# Patient Record
Sex: Male | Born: 1975
Health system: Southern US, Community
[De-identification: ages and names within clinical notes are randomized; demographics above are authoritative.]

## PROBLEM LIST (undated history)

## (undated) DIAGNOSIS — J302 Other seasonal allergic rhinitis: Secondary | ICD-10-CM

## (undated) DIAGNOSIS — Z85820 Personal history of malignant melanoma of skin: Secondary | ICD-10-CM

## (undated) DIAGNOSIS — J45909 Unspecified asthma, uncomplicated: Secondary | ICD-10-CM

## (undated) DIAGNOSIS — Z9889 Other specified postprocedural states: Secondary | ICD-10-CM

## (undated) DIAGNOSIS — K200 Eosinophilic esophagitis: Secondary | ICD-10-CM

## (undated) HISTORY — DX: Eosinophilic esophagitis: K20.0

## (undated) HISTORY — PX: WISDOM TOOTH EXTRACTION: SHX21

---

## 2009-10-11 ENCOUNTER — Ambulatory Visit: Payer: Self-pay | Admitting: Emergency Medicine

## 2009-10-11 DIAGNOSIS — M79609 Pain in unspecified limb: Secondary | ICD-10-CM | POA: Insufficient documentation

## 2009-10-11 DIAGNOSIS — S9030XA Contusion of unspecified foot, initial encounter: Secondary | ICD-10-CM | POA: Insufficient documentation

## 2009-12-06 ENCOUNTER — Ambulatory Visit: Payer: Self-pay | Admitting: Emergency Medicine

## 2009-12-06 DIAGNOSIS — M25579 Pain in unspecified ankle and joints of unspecified foot: Secondary | ICD-10-CM | POA: Insufficient documentation

## 2009-12-11 ENCOUNTER — Telehealth (INDEPENDENT_AMBULATORY_CARE_PROVIDER_SITE_OTHER): Payer: Self-pay

## 2010-02-27 NOTE — Progress Notes (Signed)
Summary: Courtesy call  Phone Note Outgoing Call   Call placed by: Areta Haber CMA,  December 11, 2009 9:34 AM Call placed to: Patient Summary of Call: Courtesy call to pt - Pt states  swelling is going down, he plans to wait until Wednesday, 12/13/09, to see how his ankle feels. If no better he will contact the office for PT or ortho referral. Pt states the only time he has problems is after icing the ankle, trying to stand on his ankle, but after walking on it for awhile, its gets better.  Pt states he's not in pain though. Initial call taken by: Areta Haber CMA,  December 11, 2009 9:41 AM

## 2010-02-27 NOTE — Assessment & Plan Note (Signed)
Summary: LEFT FOOT PAIN/NH   Vital Signs:  Patient Profile:   35 Years Old Male CC:      Left foot pain from fall yesterday Height:     71 inches Weight:      174 pounds O2 Sat:      99 % O2 treatment:    Room Air Temp:     97.5 degrees F oral Pulse rate:   70 / minute Pulse rhythm:   regular Resp:     12 per minute BP sitting:   114 / 69  (left arm) Cuff size:   regular  Vitals Entered By: Emilio Math (October 11, 2009 8:47 AM)                History of Present Illness History from: patient Chief Complaint: Left foot pain from fall yesterday History of Present Illness: 35yo WM fell off 5' tall scaffolding yesterday.  He works in [employer].  He got tangled up in the crumbling scaffolding and doesn't really know how he fell or what happened.  Now with L foot bruising around the big toe.  Mild pain.  Is able to walk.  Hasn't done anything or taken any medicines.  He just wants to make sure that it isn't broken.  REVIEW OF SYSTEMS Constitutional Symptoms      Denies fever, chills, night sweats, weight loss, weight gain, and fatigue.  Eyes       Denies change in vision, eye pain, eye discharge, glasses, contact lenses, and eye surgery. Ear/Nose/Throat/Mouth       Denies hearing loss/aids, change in hearing, ear pain, ear discharge, dizziness, frequent runny nose, frequent nose bleeds, sinus problems, sore throat, hoarseness, and tooth pain or bleeding.  Respiratory       Denies dry cough, productive cough, wheezing, shortness of breath, asthma, bronchitis, and emphysema/COPD.  Cardiovascular       Denies murmurs, chest pain, and tires easily with exhertion.    Gastrointestinal       Denies stomach pain, nausea/vomiting, diarrhea, constipation, blood in bowel movements, and indigestion. Genitourniary       Denies painful urination, kidney stones, and loss of urinary control. Neurological       Denies paralysis, seizures, and fainting/blackouts. Musculoskeletal  Complains of muscle pain, redness, and swelling.      Denies joint pain, joint stiffness, decreased range of motion, muscle weakness, and gout.  Skin       Complains of bruising.      Denies unusual mles/lumps or sores and hair/skin or nail changes.  Psych       Denies mood changes, temper/anger issues, anxiety/stress, speech problems, depression, and sleep problems.  Past History:  Past Medical History: Unremarkable  Past Surgical History: Removal of melanoma on back  Family History: Mother,Healthy father, Healthy  Social History: Non smoker ETOH-yes  No drugs [employer] Physical Exam General appearance: well developed, well nourished, no acute distress Extremities: L foot: ecchymosis over Left 1st-3rd MT.  Mild TTP over same area.  Axial loading not painful.  No TTP med/lat mall, nav, base of 5th, calc, prox fib.  FROM great toe, normal cap refill Skin: see above MSE: oriented to time, place, and person Assessment New Problems: CONTUSION OF FOOT (ICD-924.20) FOOT PAIN, LEFT (ICD-729.5)  Xray read as normal   Patient Education: Patient and/or caregiver instructed in the following: rest, Tylenol prn, Ibuprofen prn. 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  If painful and limping, should not work on scaffolding nor on a roof or dangerous locations.    The patient and/or caregiver has been counseled thoroughly with regard to medications prescribed including dosage, schedule, interactions, rationale for use, and possible side effects and they verbalize understanding.  Diagnoses and expected course of recovery discussed and will return if not improved as expected or if the condition worsens. Patient and/or caregiver verbalized understanding.   Orders Added: 1)  New Patient Level III [99203] 2)  T-DG Foot  Complete*L* [04540]

## 2010-02-27 NOTE — Assessment & Plan Note (Signed)
Summary: L ANKLE PAIN FROM FALL/WB   Vital Signs:  Patient Profile:   35 Years Old Male CC:      Left ankle injury playing softball last night Height:     71 inches Weight:      167 pounds O2 Sat:      100 % O2 treatment:    Room Air Temp:     98.3 degrees F oral Pulse rate:   70 / minute Pulse rhythm:   regular Resp:     14 per minute BP sitting:   130 / 82  (left arm) Cuff size:   regular  Pt. in pain?   yes    Location:   ankle    Intensity:   7    Type:       sharp  Vitals Entered By: Emilio Math (December 06, 2009 9:36 AM)                   Current Allergies: No known allergies History of Present Illness Chief Complaint: Left ankle injury playing softball last night History of Present Illness: Playing softball last night, slipped, fell, and twisted L ankle.  Layed on the ground for about 10 mins, but then got up and played another 3 games.  Has difficulty running.  He iced it and wrapped it up which helped.  Today is swollen with bruising and dull throbbing constant pain throughtout his ankle.  No radiation of pain.    REVIEW OF SYSTEMS Constitutional Symptoms      Denies fever, chills, night sweats, weight loss, weight gain, and fatigue.  Eyes       Denies change in vision, eye pain, eye discharge, glasses, contact lenses, and eye surgery. Ear/Nose/Throat/Mouth       Denies hearing loss/aids, change in hearing, ear pain, ear discharge, dizziness, frequent runny nose, frequent nose bleeds, sinus problems, sore throat, hoarseness, and tooth pain or bleeding.  Respiratory       Denies dry cough, productive cough, wheezing, shortness of breath, asthma, bronchitis, and emphysema/COPD.  Cardiovascular       Denies murmurs, chest pain, and tires easily with exhertion.    Gastrointestinal       Denies stomach pain, nausea/vomiting, diarrhea, constipation, blood in bowel movements, and indigestion. Genitourniary       Denies painful urination, kidney stones, and loss  of urinary control. Neurological       Denies paralysis, seizures, and fainting/blackouts. Musculoskeletal       Complains of joint pain, joint stiffness, decreased range of motion, redness, and swelling.      Denies muscle pain, muscle weakness, and gout.  Skin       Complains of bruising.      Denies unusual mles/lumps or sores and hair/skin or nail changes.  Psych       Denies mood changes, temper/anger issues, anxiety/stress, speech problems, depression, and sleep problems.  Past History:  Past Medical History: Reviewed history from 10/11/2009 and no changes required. Unremarkable  Past Surgical History: Reviewed history from 10/11/2009 and no changes required. Removal of melanoma on back  Family History: Reviewed history from 10/11/2009 and no changes required. Mother,Healthy father, Healthy  Social History: Reviewed history from 10/11/2009 and no changes required. Non smoker ETOH-yes  No drugs Construction Physical Exam General appearance: well developed, well nourished, no acute distress Heart: regular rate and  rhythm, no murmur Extremities: see below Neurological: distal NV status intact Skin: see below MSE: oriented to time, place,  and person L ankle: FROM, full strength, resisted motions are painful.  TTP medial/lateral malleolus, anterior, ATFL, and Achilles.  No TTP navicular, base of 5th, calcaneus, or proximal fibula.  + swelling and ecchymoses.  He is walking with a limp.   **physical exam was very brief prior to Xray.  Patient had to leave prior to me coming back to fully examine.  Assessment New Problems: ANKLE PAIN, LEFT (ICD-719.47)  Xray is normal Left sprained ankle  Patient Education: Patient and/or caregiver instructed in the following: rest, fluids, Ibuprofen prn.  Plan New Orders: Est. Patient Level IV [78588] T-DG Ankle Complete*L* [id number] Planning Comments:   Rest, ice, elevation, compression, ankle brace as needed *The patient had  to leave prior to me coming back into the room.  The nurse informed him that it wasn't broken according to the Xray report and he left. Follow up with sports med and/or PT if not getting better in 7-10 days   The patient and/or caregiver has been counseled thoroughly with regard to medications prescribed including dosage, schedule, interactions, rationale for use, and possible side effects and they verbalize understanding.  Diagnoses and expected course of recovery discussed and will return if not improved as expected or if the condition worsens. Patient and/or caregiver verbalized understanding.   Orders Added: 1)  Est. Patient Level IV [50277] 2)  T-DG Ankle Complete*L* [73610]  Appended Document: L ANKLE PAIN FROM FALL/WB Patient left before Dr Orson Aloe could go back in and speak to him about his ankle he advised that he had to take a child to the dentist.  I pulled up the x-ray and saw that it was not broken and let the patient know this and advised him to elevate, ice and take ibuprofen for pain as needed.

## 2011-04-04 ENCOUNTER — Emergency Department
Admit: 2011-04-04 | Discharge: 2011-04-04 | Disposition: A | Discharge status: Home / Self Care | Payer: BC Managed Care – PPO

## 2011-04-04 ENCOUNTER — Emergency Department
Admission: EM | Admit: 2011-04-04 | Discharge: 2011-04-04 | Disposition: A | Discharge status: Home Health | Payer: BC Managed Care – PPO | Source: Home / Self Care | Attending: Family Medicine | Admitting: Family Medicine

## 2011-04-04 ENCOUNTER — Encounter: Payer: Self-pay | Admitting: *Deleted

## 2011-04-04 DIAGNOSIS — R0789 Other chest pain: Secondary | ICD-10-CM

## 2011-04-04 HISTORY — DX: Other specified postprocedural states: Z85.820

## 2011-04-04 HISTORY — DX: Other specified postprocedural states: Z98.890

## 2011-04-04 HISTORY — DX: Other seasonal allergic rhinitis: J30.2

## 2011-04-04 NOTE — Discharge Instructions (Signed)
Apply ice pack once or twice daily.  Take Ibuprofen 200mg , 4 tabs every 8 hours with food.   Chest Pain (Nonspecific) Chest pain has many causes. Your pain could be caused by something serious, such as a heart attack or a blood clot in the lungs. It could also be caused by something less serious, such as a chest bruise or a virus. Follow up with your doctor. More lab tests or other studies may be needed to find the cause of your pain. Most of the time, nonspecific chest pain will improve within 2 to 3 days of rest and mild pain medicine. HOME CARE  For chest bruises, you may put ice on the sore area for 15 to 20 minutes, 3 to 4 times a day. Do this only if it makes you or your child feel better.   Put ice in a plastic bag.   Place a towel between the skin and the bag.   Rest for the next 2 to 3 days.   Go back to work if the pain improves.   See your doctor if the pain lasts longer than 1 to 2 weeks.   Only take medicine as told by your doctor.   Quit smoking if you smoke.  GET HELP RIGHT AWAY IF:   There is more pain or pain that spreads to the arm, neck, jaw, back, or belly (abdomen).   You or your child has shortness of breath.   You or your child coughs more than usual or coughs up blood.   You or your child has very bad back or belly pain, feels sick to his or her stomach (nauseous), or throws up (vomits).   You or your child has very bad weakness.   You or your child passes out (faints).   You or your child has a temperature by mouth above 102 F (38.9 C), not controlled by medicine.  Any of these problems may be serious and may be an emergency. Do not wait to see if the problems will go away. Get medical help right away. Call your local emergency services 911 in [place] Do not drive yourself to the hospital. MAKE SURE YOU:   Understand these instructions.   Will watch this condition.   Will get help right away if you or your child is not doing well or gets worse.    Document Released: 07/03/2007 Document Revised: 01/03/2011 Document Reviewed: 07/03/2007 [facility] Patient Information 2012 [place], [place].

## 2011-04-04 NOTE — ED Provider Notes (Signed)
History     CSN: [id number]  Arrival date & time 04/04/11  4098   First MD Initiated Contact with Patient 04/04/11 1940      Chief Complaint  Patient presents with  . Chest Pain      HPI Comments: Patient complains of one week history of intermittent non-radiating mild sharp pain in his left upper chest that is generally precipitated by deep inspiration and certain chest movements.  At time he feels as if his anterior chest is restricted during inspiration.  No shortness of breath or wheezing.  No cough.  No fevers, chills, and sweats.  No palpitations.  No recent respiratory illness.  Symptoms improved by ibuprofen.  No GI symptoms   Patient is a 36 y.o. male presenting with chest pain. The history is provided by the patient.  Chest Pain Episode onset: 1 week ago. Chest pain occurs frequently. The chest pain is unchanged. The pain is associated with breathing and lifting. The pain is currently at 0/10. The severity of the pain is mild. The quality of the pain is described as sharp. The pain does not radiate. Chest pain is worsened by deep breathing and certain positions. Pertinent negatives for primary symptoms include no fever, no fatigue, no syncope, no shortness of breath, no cough, no wheezing, no palpitations, no abdominal pain, no nausea, no vomiting and no dizziness.  Pertinent negatives for associated symptoms include no lower extremity edema. He tried NSAIDs for the symptoms. Risk factors include male gender. Past medical history comments: Unremarkable  His family medical history is significant for hypertension in family.     No pertinent past medical history  Past surgical history:  Resection "wisdom teeth,"  Resection melanoma back  Family history:  Father with hypertension   History  Substance Use Topics  . Smoking status: Non smoker  . Smokeless tobacco: Not on file  . Alcohol Use: Yes      Review of Systems  Constitutional: Negative for fever and fatigue.    Respiratory: Negative for cough, shortness of breath and wheezing.   Cardiovascular: Positive for chest pain. Negative for palpitations and syncope.  Gastrointestinal: Negative for nausea, vomiting and abdominal pain.  Neurological: Negative for dizziness.   No sore throat No cough No pleuritic pain, but has mild left chest discomfort during deep inspiration No wheezing No nasal congestion No post-nasal drainage No sinus pain/pressure No itchy/red eyes No earache No hemoptysis No SOB No fever/chills No nausea No vomiting No abdominal pain No diarrhea No urinary symptoms No skin rashes No fatigue No myalgias No headache Used OTC meds without relief (ibuprofen)  Allergies  Review of patient's allergies indicates no known medication allergies.  Home Medications   Current Outpatient Rx  Name Route Sig Dispense Refill  . FEXOFENADINE HCL 180 MG PO TABS Oral Take 180 mg by mouth daily.      BP 134/87  Pulse 79  Temp(Src) 98.5 F (36.9 C) (Oral)  Resp 16  Ht 5\' 11"  (1.803 m)  Wt 174 lb (78.926 kg)  BMI 24.27 kg/m2  SpO2 100%  Physical Exam Nursing notes and Vital Signs reviewed. Appearance:  Patient appears healthy, stated age, and in no acute distress Eyes:  Pupils are equal, round, and reactive to light and accomodation.  Extraocular movement is intact.  Conjunctivae are not inflamed  Pharynx:  Normal Neck:  Supple.   No adenopathy.  Carotids have normal upstrokes Lungs:  Clear to auscultation.  Breath sounds are equal.  Chest:  No  tenderness over sternum.  Over the left lower edge of pectoralis muscle, there is localized mild tenderness over an intercostal muscle.  Pain is elicited while palpating the intercostal muscle during resisted contraction of the left pectoralis Heart:  Regular rate and rhythm without murmurs, rubs, or gallops.  Abdomen:  Nontender without masses or hepatosplenomegaly.  Bowel sounds are present.  No CVA or flank tenderness.   Extremities:  No edema.  No calf tenderness Skin:  No rash present.   ED Course  Procedures  none   Dg Chest 2 View  04/04/2011  *RADIOLOGY REPORT*  Clinical Data: Left anterior chest pain with deep inspiration.  CHEST - 2 VIEW  Comparison: None.  Findings: Heart size and vascularity are normal and the lungs are clear.  No osseous abnormality.  IMPRESSION: Normal chest.  Original Report Authenticated By: Gwynn Burly, M.D.    EKG:  No acute changes  1. Chest pain, musculoskeletal       MDM  Reassurance Apply ice pack once or twice daily.  Take Ibuprofen 200mg , 4 tabs every 8 hours with food. Be careful when lifting. Followup with Family Doctor if not improved in one week or if symptoms worsen        Donna Christen, MD 04/04/11 2017

## 2011-04-04 NOTE — ED Notes (Signed)
Pt c/o LT sided chest pain x 1 wk with inhalation. Denies injury.

## 2012-01-29 HISTORY — PX: ESOPHAGOGASTRODUODENOSCOPY: SHX1529

## 2014-05-01 ENCOUNTER — Encounter: Payer: Self-pay | Admitting: Emergency Medicine

## 2014-05-01 ENCOUNTER — Emergency Department (INDEPENDENT_AMBULATORY_CARE_PROVIDER_SITE_OTHER)
Admission: EM | Admit: 2014-05-01 | Discharge: 2014-05-01 | Disposition: A | Discharge status: Home / Self Care | Payer: BLUE CROSS/BLUE SHIELD | Source: Home / Self Care | Attending: Emergency Medicine | Admitting: Emergency Medicine

## 2014-05-01 DIAGNOSIS — J322 Chronic ethmoidal sinusitis: Secondary | ICD-10-CM

## 2014-05-01 MED ORDER — CETIRIZINE-PSEUDOEPHEDRINE ER 5-120 MG PO TB12: 1.0000 | ORAL_TABLET | Freq: Every day | ORAL | Status: DC

## 2014-05-01 MED ORDER — AMOXICILLIN-POT CLAVULANATE 875-125 MG PO TABS: 1.0000 | ORAL_TABLET | Freq: Two times a day (BID) | ORAL | Status: DC

## 2014-05-01 NOTE — ED Provider Notes (Signed)
CSN: [id number]     Arrival date & time 05/01/14  1317 History   First MD Initiated Contact with Patient 05/01/14 1349     Chief Complaint  Patient presents with  . Sinusitis   (Consider location/radiation/quality/duration/timing/severity/associated sxs/prior Treatment) Patient is a 39 y.o. male presenting with sinusitis. The history is provided by the patient. No language interpreter was used.  Sinusitis Pain details:    Location:  Frontal and maxillary   Quality:  Aching   Severity:  Moderate   Duration:  2 weeks   Timing:  Constant Duration:  2 weeks Progression:  Worsening Chronicity:  New Relieved by:  Nothing Worsened by:  Nothing tried Ineffective treatments:  None tried Associated symptoms: rhinorrhea and sore throat   Associated symptoms: no headaches   Risk factors: no allergic reaction and no asthma     Past Medical History  Diagnosis Date  . Seasonal allergies   . History of melanoma excision    Past Surgical History  Procedure Laterality Date  . Wisdom tooth extraction     Family History  Problem Relation Age of Onset  . Hypertension Father    History  Substance Use Topics  . Smoking status: Never Smoker   . Smokeless tobacco: Current User  . Alcohol Use: 1.2 oz/week    2 Cans of beer per week     Comment: occassionally    Review of Systems  HENT: Positive for rhinorrhea, sinus pressure and sore throat.   Neurological: Negative for headaches.  All other systems reviewed and are negative.   Allergies  Review of patient's allergies indicates no known allergies.  Home Medications   Prior to Admission medications   Medication Sig Start Date End Date Taking? Authorizing Provider  amoxicillin-clavulanate (AUGMENTIN) 875-125 MG per tablet Take 1 tablet by mouth every 12 (twelve) hours. 05/01/14   Fransico Meadow, PA-C  cetirizine-pseudoephedrine (ZYRTEC-D) 5-120 MG per tablet Take 1 tablet by mouth daily. 05/01/14   Fransico Meadow, PA-C  fexofenadine  (ALLEGRA) 180 MG tablet Take 180 mg by mouth daily.    Historical Provider, MD   BP 118/83 mmHg  Pulse 78  Temp(Src) 97.4 F (36.3 C) (Oral)  Wt 178 lb (80.74 kg)  SpO2 98% Physical Exam  Constitutional: He is oriented to person, place, and time. He appears well-developed and well-nourished.  HENT:  Head: Normocephalic and atraumatic.  Eyes: Conjunctivae and EOM are normal. Pupils are equal, round, and reactive to light.  Neck: Normal range of motion.  Cardiovascular: Normal rate and normal heart sounds.   Pulmonary/Chest: Effort normal.  Abdominal: Soft. He exhibits no distension.  Musculoskeletal: Normal range of motion.  Neurological: He is alert and oriented to person, place, and time.  Skin: Skin is warm.  Psychiatric: He has a normal mood and affect.  Nursing note and vitals reviewed.   ED Course  Procedures (including critical care time) Labs Review Labs Reviewed - No data to display  Imaging Review No results found.   MDM   1. Ethmoid sinusitis, unspecified chronicity    augmentin Zyrtec Return if any problems. AVS    Fransico Meadow, PA-C 05/01/14 1622

## 2014-05-01 NOTE — ED Notes (Signed)
Pt c/o runny nose, HA, post nasal drip and facial pain/pressure x2 weeks. Denies fever...states some cough and sore throat that he believes is allergy related.

## 2014-05-01 NOTE — Discharge Instructions (Signed)

## 2014-06-01 ENCOUNTER — Emergency Department (INDEPENDENT_AMBULATORY_CARE_PROVIDER_SITE_OTHER): Payer: BLUE CROSS/BLUE SHIELD

## 2014-06-01 ENCOUNTER — Encounter: Payer: Self-pay | Admitting: *Deleted

## 2014-06-01 ENCOUNTER — Emergency Department (INDEPENDENT_AMBULATORY_CARE_PROVIDER_SITE_OTHER)
Admission: EM | Admit: 2014-06-01 | Discharge: 2014-06-01 | Disposition: A | Discharge status: Home / Self Care | Payer: BLUE CROSS/BLUE SHIELD | Source: Home / Self Care | Attending: Family Medicine | Admitting: Family Medicine

## 2014-06-01 DIAGNOSIS — R0981 Nasal congestion: Secondary | ICD-10-CM | POA: Diagnosis not present

## 2014-06-01 DIAGNOSIS — J302 Other seasonal allergic rhinitis: Secondary | ICD-10-CM | POA: Diagnosis not present

## 2014-06-01 MED ORDER — MOMETASONE FUROATE 50 MCG/ACT NA SUSP: NASAL | Status: DC

## 2014-06-01 MED ORDER — MONTELUKAST SODIUM 10 MG PO TABS: 10.0000 mg | ORAL_TABLET | Freq: Every day | ORAL | Status: DC

## 2014-06-01 MED ORDER — TRIAMCINOLONE ACETONIDE 40 MG/ML IJ SUSP
40.0000 mg | Freq: Once | INTRAMUSCULAR | Status: AC
  Administered 2014-06-01: 40 mg via INTRAMUSCULAR

## 2014-06-01 NOTE — ED Notes (Signed)
Pt c/o nasal congestion x 3-4 mths. He has taken Decongestants, Amoxicillin, and Afrin with no relief.

## 2014-06-01 NOTE — Discharge Instructions (Signed)
Continue antihistamine daily, and alternate to a different antihistamine each month.  Take pseudoephedrine for congestion.  Stop Afrin in one side of nose and continue other side, followed by Nasonex spray.  After about 5 to 7 days resume Afrin in the other nostril, following with Nasonex spray.  Stop Afrin in about 5 days.  Consider using saline irrigation after nasal congestion decreases.    Allergic Rhinitis Allergic rhinitis is when the mucous membranes in the nose respond to allergens. Allergens are particles in the air that cause your body to have an allergic reaction. This causes you to release allergic antibodies. Through a chain of events, these eventually cause you to release histamine into the blood stream. Although meant to protect the body, it is this release of histamine that causes your discomfort, such as frequent sneezing, congestion, and an itchy, runny nose.  CAUSES  Seasonal allergic rhinitis (hay fever) is caused by pollen allergens that may come from grasses, trees, and weeds. Year-round allergic rhinitis (perennial allergic rhinitis) is caused by allergens such as house dust mites, pet dander, and mold spores.  SYMPTOMS   Nasal stuffiness (congestion).  Itchy, runny nose with sneezing and tearing of the eyes. DIAGNOSIS  Your health care provider can help you determine the allergen or allergens that trigger your symptoms. If you and your health care provider are unable to determine the allergen, skin or blood testing may be used. TREATMENT  Allergic rhinitis does not have a cure, but it can be controlled by:  Medicines and allergy shots (immunotherapy).  Avoiding the allergen. Hay fever may often be treated with antihistamines in pill or nasal spray forms. Antihistamines block the effects of histamine. There are over-the-counter medicines that may help with nasal congestion and swelling around the eyes. Check with your health care provider before taking or giving this  medicine.  If avoiding the allergen or the medicine prescribed do not work, there are many new medicines your health care provider can prescribe. Stronger medicine may be used if initial measures are ineffective. Desensitizing injections can be used if medicine and avoidance does not work. Desensitization is when a patient is given ongoing shots until the body becomes less sensitive to the allergen. Make sure you follow up with your health care provider if problems continue. HOME CARE INSTRUCTIONS It is not possible to completely avoid allergens, but you can reduce your symptoms by taking steps to limit your exposure to them. It helps to know exactly what you are allergic to so that you can avoid your specific triggers. SEEK MEDICAL CARE IF:   You have a fever.  You develop a cough that does not stop easily (persistent).  You have shortness of breath.  You start wheezing.  Symptoms interfere with normal daily activities. Document Released: 10/09/2000 Document Revised: 01/19/2013 Document Reviewed: 09/21/2012 [facility] Patient Information 2015 [place], [place]. This information is not intended to replace advice given to you by your health care provider. Make sure you discuss any questions you have with your health care provider.

## 2014-06-01 NOTE — ED Provider Notes (Signed)
CSN: [id number]     Arrival date & time 06/01/14  1753 History   First MD Initiated Contact with Patient 06/01/14 1812     Chief Complaint  Patient presents with  . Nasal Congestion      HPI Comments: Patient has a history of seasonal rhinitis that has become increasingly severe.  In the past he could control his symptoms by alternating different antihistamines such as Allegra, Claritin, and Zyrtec. He has tried Flonase in the past without much success.  One month ago he was treated with Augmentin for sinusitis, but did not have much improvement.  He has now been using Afrin spray in order to breath through his nares at night.  He has had poor sleep as a result of mouth breathing.  The history is provided by the patient.    Past Medical History  Diagnosis Date  . Seasonal allergies   . History of melanoma excision    Past Surgical History  Procedure Laterality Date  . Wisdom tooth extraction     Family History  Problem Relation Age of Onset  . Hypertension Father    History  Substance Use Topics  . Smoking status: Never Smoker   . Smokeless tobacco: Current User  . Alcohol Use: 1.2 oz/week    2 Cans of beer per week     Comment: occassionally    Review of Systems + sore throat + cough No pleuritic pain No wheezing + nasal congestion + post-nasal drainage + sinus pain/pressure + eye drainage No earache No hemoptysis No SOB No fever/chills No nausea No vomiting No abdominal pain No diarrhea No urinary symptoms No skin rash + fatigue No myalgias + headache Used OTC meds without relief  Allergies  Review of patient's allergies indicates no known allergies.  Home Medications   Prior to Admission medications   Medication Sig Start Date End Date Taking? Authorizing Provider  cetirizine-pseudoephedrine (ZYRTEC-D) 5-120 MG per tablet Take 1 tablet by mouth daily. 05/01/14   Fransico Meadow, PA-C  fexofenadine (ALLEGRA) 180 MG tablet Take 180 mg by mouth daily.     Historical Provider, MD  mometasone (NASONEX) 50 MCG/ACT nasal spray Place 2 sprays in each side of nose once daily 06/01/14   Kandra Nicolas, MD  montelukast (SINGULAIR) 10 MG tablet Take 1 tablet (10 mg total) by mouth at bedtime. 06/01/14   Kandra Nicolas, MD   BP 123/83 mmHg  Pulse 80  Temp(Src) 97.8 F (36.6 C) (Oral)  Resp 16  Ht 5\' 11"  (1.803 m)  Wt 180 lb (81.647 kg)  BMI 25.12 kg/m2  SpO2 98% Physical Exam Nursing notes and Vital Signs reviewed. Appearance:  Patient appears stated age, and in no acute distress Eyes:  Pupils are equal, round, and reactive to light and accomodation.  Extraocular movement is intact.  Conjunctivae are not inflamed  Ears:  Canals normal.  Tympanic membranes normal.  Nose:   Congested turbinates with clear discharge.  No sinus tenderness.    Pharynx:  Normal Neck:  Supple.  No adenopathy Lungs:  Clear to auscultation.  Breath sounds are equal.  Heart:  Regular rate and rhythm without murmurs, rubs, or gallops.    Skin:  No rash present.   ED Course  Procedures  none  Imaging Review Dg Sinuses Complete  06/01/2014   CLINICAL DATA:  Sinus congestion for 1 month  EXAM: PARANASAL SINUSES - COMPLETE 3 + VIEW  COMPARISON:  None.  FINDINGS: The frontal sinuses are hypoplastic.  The ethmoid, maxillary and sphenoid sinuses are well aerated without focal air-fluid level or mucosal thickening. No acute bony abnormality is noted.  IMPRESSION: Hypoplastic frontal sinuses.  No acute abnormality is noted.   Electronically Signed   By: Inez Catalina M.D.   On: 06/01/2014 18:51     MDM   1. Seasonal allergic rhinitis    Kenalog 40mg  IM Begin trial of Singulair 10mg  HS.  Begin trial of Nasonex Continue antihistamine daily, and alternate to a different antihistamine each month.  Take pseudoephedrine for congestion.  Stop Afrin in one side of nose and continue other side, followed by Nasonex spray.  After about 5 to 7 days resume Afrin in the other nostril,  following with Nasonex spray.  Stop Afrin in about 5 days.  Consider using saline irrigation after nasal congestion decreases. Followup with allergist if not improving about one month.    Kandra Nicolas, MD 06/01/14 [phone]

## 2014-06-04 ENCOUNTER — Telehealth: Payer: Self-pay | Admitting: Emergency Medicine

## 2014-08-18 ENCOUNTER — Emergency Department (INDEPENDENT_AMBULATORY_CARE_PROVIDER_SITE_OTHER)
Admission: EM | Admit: 2014-08-18 | Discharge: 2014-08-18 | Disposition: A | Discharge status: Home / Self Care | Payer: BLUE CROSS/BLUE SHIELD | Source: Home / Self Care | Attending: Emergency Medicine | Admitting: Emergency Medicine

## 2014-08-18 ENCOUNTER — Encounter: Payer: Self-pay | Admitting: Emergency Medicine

## 2014-08-18 DIAGNOSIS — J209 Acute bronchitis, unspecified: Secondary | ICD-10-CM | POA: Diagnosis not present

## 2014-08-18 DIAGNOSIS — J0101 Acute recurrent maxillary sinusitis: Secondary | ICD-10-CM | POA: Diagnosis not present

## 2014-08-18 DIAGNOSIS — J309 Allergic rhinitis, unspecified: Secondary | ICD-10-CM | POA: Diagnosis not present

## 2014-08-18 MED ORDER — PROMETHAZINE-CODEINE 6.25-10 MG/5ML PO SYRP: ORAL_SOLUTION | ORAL | Status: DC

## 2014-08-18 MED ORDER — MONTELUKAST SODIUM 10 MG PO TABS: 10.0000 mg | ORAL_TABLET | Freq: Every day | ORAL | Status: DC

## 2014-08-18 MED ORDER — PREDNISONE 10 MG (21) PO TBPK: ORAL_TABLET | ORAL | Status: DC

## 2014-08-18 MED ORDER — AZITHROMYCIN 250 MG PO TABS: ORAL_TABLET | ORAL | Status: DC

## 2014-08-18 MED ORDER — TRIAMCINOLONE ACETONIDE 40 MG/ML IJ SUSP
40.0000 mg | Freq: Once | INTRAMUSCULAR | Status: AC
  Administered 2014-08-18: 40 mg via INTRAMUSCULAR

## 2014-08-18 MED ORDER — MOMETASONE FUROATE 50 MCG/ACT NA SUSP: 2.0000 | Freq: Every day | NASAL | Status: DC

## 2014-08-18 NOTE — ED Notes (Signed)
Productive cough, congestion, runny nose, headache, can't sleep x 6 days

## 2014-08-18 NOTE — ED Provider Notes (Addendum)
CSN: [id number]     Arrival date & time 08/18/14  5573 History   First MD Initiated Contact with Patient 08/18/14 0902     Chief Complaint  Patient presents with  . Cough    HPI URI HISTORY  Shane Thomas is a 39 y.o. male who complains of onset of cold symptoms for 6 days. This is a seasonal and recurrent problem.  Back in May, was seen here and shot of Kenalog was very helpful.  He has recurrent seasonal allergic rhinitis. He thinks the Singulair prescribed by Dr. Angelica Ran 10 mg at bedtime was helpful, but he ran out of that.  May have a low-grade fever. He is not sure. No chills/sweats Complains of discolored rhinorrhea. Perhaps discolored sputum.  No sinus pain/pressure No sore throat Occasional wheezing with cough Positive chest congestion No hemoptysis No shortness of breath No pleuritic pain  No itchy/red eyes No earache  No nausea No vomiting No abdominal pain No diarrhea  No skin rashes +  Fatigue No myalgias No headache   He does not have a PCP or allergist. He denies chronically using Afrin  Past Medical History  Diagnosis Date  . Seasonal allergies   . History of melanoma excision    Past Surgical History  Procedure Laterality Date  . Wisdom tooth extraction     Family History  Problem Relation Age of Onset  . Hypertension Father    History  Substance Use Topics  . Smoking status: Never Smoker   . Smokeless tobacco: Current User  . Alcohol Use: 1.2 oz/week    2 Cans of beer per week     Comment: occassionally    Review of Systems Remainder of Review of Systems negative for acute change except as noted in the HPI.  Allergies  Review of patient's allergies indicates not on file.  Home Medications   Prior to Admission medications   Medication Sig Start Date End Date Taking? Authorizing Provider  azithromycin (ZITHROMAX Z-PAK) 250 MG tablet Take 2 tablets on day one, then 1 tablet daily on days 2 through 5 08/18/14   Jacqulyn Cane, MD  mometasone  (NASONEX) 50 MCG/ACT nasal spray Place 2 sprays into the nose daily. 1 or 2 sprays in each nostril twice daily 08/18/14   Jacqulyn Cane, MD  montelukast (SINGULAIR) 10 MG tablet Take 1 tablet (10 mg total) by mouth at bedtime. 08/18/14   Jacqulyn Cane, MD  predniSONE (STERAPRED UNI-PAK 21 TAB) 10 MG (21) TBPK tablet Take as directed for 6 days.--Take 6 on day 1, 5 on day 2, 4 on day 3, then 3 tablets on day 4, then 2 tablets on day 5, then 1 on day 6. 08/18/14   Jacqulyn Cane, MD  promethazine-codeine [facility] WITH CODEINE) 6.25-10 MG/5ML syrup Take 1-2 teaspoons every 4-6 hours as needed for cough. May cause drowsiness. 08/18/14   Jacqulyn Cane, MD   BP 122/80 mmHg  Pulse 78  Temp(Src) 97.7 F (36.5 C) (Oral)  Ht 5\' 11"  (1.803 m)  Wt 183 lb (83.008 kg)  BMI 25.53 kg/m2  SpO2 96% Physical Exam  Constitutional: He is oriented to person, place, and time. He appears well-developed and well-nourished. No distress.  HENT:  Head: Normocephalic and atraumatic.  Right Ear: Tympanic membrane, external ear and ear canal normal.  Left Ear: Tympanic membrane, external ear and ear canal normal.  Nose: Mucosal edema and rhinorrhea present. Right sinus exhibits maxillary sinus tenderness. Left sinus exhibits maxillary sinus tenderness.  Mouth/Throat: Oropharynx is clear  and moist. No oral lesions. No oropharyngeal exudate.  Very boggy turbinates seromucoid drainage. Prominent lymphoid hyperplasia in serous postnasal drainage posterior pharynx  Eyes: Right eye exhibits no discharge. Left eye exhibits no discharge. No scleral icterus.  Neck: Neck supple. No JVD present.  Cardiovascular: Normal rate, regular rhythm and normal heart sounds.   Pulmonary/Chest: Effort normal and breath sounds normal. He has no wheezes. He has no rales.  Lungs clear except for minimal expiratory wheeze anteriorly on forced expiration  Abdominal: There is no tenderness.  Musculoskeletal: He exhibits no edema.  Lymphadenopathy:     He has no cervical adenopathy.  Neurological: He is alert and oriented to person, place, and time.  Skin: Skin is warm and dry. No rash noted.  Psychiatric: He has a normal mood and affect.  Nursing note and vitals reviewed.   ED Course  Procedures (including critical care time) Labs Review Labs Reviewed - No data to display  Imaging Review No results found.   MDM   1. Acute recurrent maxillary sinusitis   2. Allergic sinusitis   3. Acute bronchitis with bronchospasm    Treatment options discussed, as well as risks, benefits, alternatives. Patient voiced understanding and agreement with the following plans: Kenalog 40 mg IM stat, as he states that worked great in May and he requests that now. Prednisone 10 mg-six-day dosepak Z-Pak for her bacterial and atypical coverage New Prescriptions   AZITHROMYCIN (ZITHROMAX Z-PAK) 250 MG TABLET    Take 2 tablets on day one, then 1 tablet daily on days 2 through 5   MOMETASONE (NASONEX) 50 MCG/ACT NASAL SPRAY    Place 2 sprays into the nose daily. 1 or 2 sprays in each nostril twice daily   MONTELUKAST (SINGULAIR) 10 MG TABLET    Take 1 tablet (10 mg total) by mouth at bedtime.   PREDNISONE (STERAPRED UNI-PAK 21 TAB) 10 MG (21) TBPK TABLET    Take as directed for 6 days.--Take 6 on day 1, 5 on day 2, 4 on day 3, then 3 tablets on day 4, then 2 tablets on day 5, then 1 on day 6.   PROMETHAZINE-CODEINE (PHENERGAN WITH CODEINE) 6.25-10 MG/5ML SYRUP    Take 1-2 teaspoons every 4-6 hours as needed for cough. May cause drowsiness.   We made him an appointment for establishing with PCP with Dr. Georgina Snell at primary care in [place] and to recheck within 2-3 weeks. Precautions discussed. Other advice given Red flags discussed. Questions invited and answered. Patient voiced understanding and agreement.     Jacqulyn Cane, MD 08/18/14 1694  Jacqulyn Cane, MD 08/18/14 [phone]

## 2014-09-01 ENCOUNTER — Encounter: Payer: Self-pay | Admitting: Family Medicine

## 2014-09-01 ENCOUNTER — Ambulatory Visit (INDEPENDENT_AMBULATORY_CARE_PROVIDER_SITE_OTHER): Payer: BLUE CROSS/BLUE SHIELD | Admitting: Family Medicine

## 2014-09-01 VITALS — BP 129/79 | HR 77 | Ht 71.0 in | Wt 181.0 lb

## 2014-09-01 DIAGNOSIS — Z720 Tobacco use: Secondary | ICD-10-CM

## 2014-09-01 DIAGNOSIS — J3089 Other allergic rhinitis: Secondary | ICD-10-CM | POA: Diagnosis not present

## 2014-09-01 DIAGNOSIS — J309 Allergic rhinitis, unspecified: Secondary | ICD-10-CM | POA: Insufficient documentation

## 2014-09-01 MED ORDER — MOMETASONE FUROATE 50 MCG/ACT NA SUSP: 2.0000 | Freq: Two times a day (BID) | NASAL | Status: DC

## 2014-09-01 MED ORDER — MONTELUKAST SODIUM 10 MG PO TABS: 10.0000 mg | ORAL_TABLET | Freq: Every day | ORAL | Status: DC

## 2014-09-01 NOTE — Progress Notes (Signed)
Shane Thomas is a 39 y.o. male who presents to [facility]  today for establish care and discuss Raynaud's postnasal drip and congestion. Symptoms present ongoing for years typically worse in the spring and summer. He thinks outside allergies are causing his problem is he is always worse after he mows his lawn. He does have an indoor indoor dog that does have access to his bedroom.  He has used Nasonex and Singulair which have helped some. He notes nasal congestion and runny nose and itchy watery eyes post nasal drip and occasional dry nonproductive cough.   Past Medical History  Diagnosis Date  . Seasonal allergies   . History of melanoma excision    Past Surgical History  Procedure Laterality Date  . Wisdom tooth extraction     History  Substance Use Topics  . Smoking status: Never Smoker   . Smokeless tobacco: Current User  . Alcohol Use: 1.2 oz/week    2 Cans of beer per week     Comment: occassionally   ROS as above Medications: Current Outpatient Prescriptions  Medication Sig Dispense Refill  . mometasone (NASONEX) 50 MCG/ACT nasal spray Place 2 sprays into the nose 2 (two) times daily. 1 or 2 sprays in each nostril twice daily 17 g 12  . montelukast (SINGULAIR) 10 MG tablet Take 1 tablet (10 mg total) by mouth at bedtime. 90 tablet 3   No current facility-administered medications for this visit.   Not on File   Exam:  BP 129/79 mmHg  Pulse 77  Ht 5\' 11"  (1.803 m)  Wt 181 lb (82.101 kg)  BMI 25.26 kg/m2 Gen: Well NAD HEENT: EOMI,  MMM inflamed nasal tremors bilaterally with cobblestoning of the posterior pharynx. Normal tympanic membranes bilaterally Lungs: Normal work of breathing. CTABL Heart: RRR no MRG Abd: NABS, Soft. Nondistended, Nontender Exts: Brisk capillary refill, warm and well perfused.   No results found for this or any previous visit (from the past 24 hour(s)). No results found.   Please see individual  assessment and plan sections.

## 2014-09-01 NOTE — Assessment & Plan Note (Signed)
Persistent allergic rhinitis. Trial of Nasonex Singulair Zyrtec and Zaditor. If not better will refer to allergy for allergy testing.

## 2014-09-01 NOTE — Patient Instructions (Signed)
Thank you for coming in today. Take zyrtec of allegra or claratin daily.  Continue the nasal spray twice daily.  Continue Singulair. Keep the dog out of the bedroom. Using a N-95 mask to mow the lawn or get someone else to do it.  Return for fasting check up on your birthday or sooner if needed.  Call or go to the emergency room if you get worse, have trouble breathing, have chest pains, or palpitations.   Use Zaditor eye drops as needed for itchy eyes.   Allergic Rhinitis Allergic rhinitis is when the mucous membranes in the nose respond to allergens. Allergens are particles in the air that cause your body to have an allergic reaction. This causes you to release allergic antibodies. Through a chain of events, these eventually cause you to release histamine into the blood stream. Although meant to protect the body, it is this release of histamine that causes your discomfort, such as frequent sneezing, congestion, and an itchy, runny nose.  CAUSES  Seasonal allergic rhinitis (hay fever) is caused by pollen allergens that may come from grasses, trees, and weeds. Year-round allergic rhinitis (perennial allergic rhinitis) is caused by allergens such as house dust mites, pet dander, and mold spores.  SYMPTOMS   Nasal stuffiness (congestion).  Itchy, runny nose with sneezing and tearing of the eyes. DIAGNOSIS  Your health care provider can help you determine the allergen or allergens that trigger your symptoms. If you and your health care provider are unable to determine the allergen, skin or blood testing may be used. TREATMENT  Allergic rhinitis does not have a cure, but it can be controlled by:  Medicines and allergy shots (immunotherapy).  Avoiding the allergen. Hay fever may often be treated with antihistamines in pill or nasal spray forms. Antihistamines block the effects of histamine. There are over-the-counter medicines that may help with nasal congestion and swelling around the eyes.  Check with your health care provider before taking or giving this medicine.  If avoiding the allergen or the medicine prescribed do not work, there are many new medicines your health care provider can prescribe. Stronger medicine may be used if initial measures are ineffective. Desensitizing injections can be used if medicine and avoidance does not work. Desensitization is when a patient is given ongoing shots until the body becomes less sensitive to the allergen. Make sure you follow up with your health care provider if problems continue. HOME CARE INSTRUCTIONS It is not possible to completely avoid allergens, but you can reduce your symptoms by taking steps to limit your exposure to them. It helps to know exactly what you are allergic to so that you can avoid your specific triggers. SEEK MEDICAL CARE IF:   You have a fever.  You develop a cough that does not stop easily (persistent).  You have shortness of breath.  You start wheezing.  Symptoms interfere with normal daily activities. Document Released: 10/09/2000 Document Revised: 01/19/2013 Document Reviewed: 09/21/2012 [facility] Patient Information 2015 [place], [place]. This information is not intended to replace advice given to you by your health care provider. Make sure you discuss any questions you have with your health care provider.

## 2014-09-20 ENCOUNTER — Encounter: Payer: Self-pay | Admitting: *Deleted

## 2014-09-20 ENCOUNTER — Emergency Department (INDEPENDENT_AMBULATORY_CARE_PROVIDER_SITE_OTHER)
Admission: EM | Admit: 2014-09-20 | Discharge: 2014-09-20 | Disposition: A | Discharge status: Home / Self Care | Payer: BLUE CROSS/BLUE SHIELD | Source: Home / Self Care | Attending: Family Medicine | Admitting: Family Medicine

## 2014-09-20 DIAGNOSIS — L282 Other prurigo: Secondary | ICD-10-CM | POA: Diagnosis not present

## 2014-09-20 MED ORDER — DIPHENHYDRAMINE HCL 25 MG PO TABS: 25.0000 mg | ORAL_TABLET | Freq: Four times a day (QID) | ORAL | Status: DC

## 2014-09-20 MED ORDER — TRIAMCINOLONE ACETONIDE 0.1 % EX CREA: 1.0000 "application " | TOPICAL_CREAM | Freq: Two times a day (BID) | CUTANEOUS | Status: DC

## 2014-09-20 NOTE — ED Notes (Signed)
Pt c/o rash on his LT rib area x 3-4 days. Denies pain.

## 2014-09-20 NOTE — ED Provider Notes (Signed)
CSN: [id number]     Arrival date & time 09/20/14  1715 History   First MD Initiated Contact with Patient 09/20/14 1720     Chief Complaint  Patient presents with  . Rash   (Consider location/radiation/quality/duration/timing/severity/associated sxs/prior Treatment) HPI Pt is a 39yo male presenting to [facility] with c/o 3-4 day hx of gradually worsening erythematous pruritic rash on Left side of chest wall. Pt states the rash looks like mosquito bites and itches like mosquito bites but are of redness if bigger than a normal mosquito bite. Denies pain. Itching is mild to moderate in severity. States he has tried not to scratch the rash. Denies know contact with poison ivy. No known bug bites. Denies fever, chills, n/v/d. Denies new soaps, lotions or medications. Denies sick contacts or recent travel.  Past Medical History  Diagnosis Date  . Seasonal allergies   . History of melanoma excision    Past Surgical History  Procedure Laterality Date  . Wisdom tooth extraction     Family History  Problem Relation Age of Onset  . Hypertension Father    Social History  Substance Use Topics  . Smoking status: Never Smoker   . Smokeless tobacco: Current User  . Alcohol Use: 1.2 oz/week    2 Cans of beer per week     Comment: occassionally    Review of Systems  Constitutional: Negative for fever and chills.  Respiratory: Negative for cough and shortness of breath.   Cardiovascular: Negative for chest pain and palpitations.  Gastrointestinal: Negative for nausea, vomiting, abdominal pain and diarrhea.  Musculoskeletal: Negative for myalgias and arthralgias.  Skin: Positive for rash. Negative for color change and wound.    Allergies  Review of patient's allergies indicates no known allergies.  Home Medications   Prior to Admission medications   Medication Sig Start Date End Date Taking? Authorizing Provider  diphenhydrAMINE (BENADRYL) 25 MG tablet Take 1 tablet (25 mg total) by mouth every  6 (six) hours. 09/20/14   Noland Fordyce, PA-C  mometasone (NASONEX) 50 MCG/ACT nasal spray Place 2 sprays into the nose 2 (two) times daily. 1 or 2 sprays in each nostril twice daily 09/01/14   Gregor Hams, MD  montelukast (SINGULAIR) 10 MG tablet Take 1 tablet (10 mg total) by mouth at bedtime. 09/01/14   Gregor Hams, MD  triamcinolone cream (KENALOG) 0.1 % Apply 1 application topically 2 (two) times daily. 09/20/14   Noland Fordyce, PA-C   BP 109/77 mmHg  Pulse 79  Temp(Src) 98.5 F (36.9 C) (Oral)  Resp 18  Ht 5\' 11"  (1.803 m)  Wt 179 lb (81.194 kg)  BMI 24.98 kg/m2  SpO2 98% Physical Exam  Constitutional: He is oriented to person, place, and time. He appears well-developed and well-nourished.  HENT:  Head: Normocephalic and atraumatic.  Eyes: EOM are normal.  Neck: Normal range of motion.  Cardiovascular: Normal rate.   Pulmonary/Chest: Effort normal and breath sounds normal. No respiratory distress. He exhibits no tenderness.  Musculoskeletal: Normal range of motion.  Neurological: He is alert and oriented to person, place, and time.  Skin: Skin is warm and dry. Rash noted. There is erythema.     3 raised linear lesions of erythema c/w wheal, centralized yellow pustule. No bleeding or discharge. Non-tender.   Psychiatric: He has a normal mood and affect. His behavior is normal.  Nursing note and vitals reviewed.   ED Course  Procedures (including critical care time) Labs Review Labs Reviewed - No  data to display  Imaging Review No results found.   MDM   1. Pruritic rash     Pt presenting to [facility] with rash on left side of chest wall. Rash c/w insect bite vs local allergic reaction. No evidence of underlying infection. Appearance of rash not c/w shingles. Rx: triamcinolone and benadryl 25mg  Q6h as needed. F/u with PCP in 1 week if not improving, sooner if worsening. Patient verbalized understanding and agreement with treatment plan.     Noland Fordyce, PA-C 09/20/14  [phone]

## 2014-12-28 ENCOUNTER — Ambulatory Visit (INDEPENDENT_AMBULATORY_CARE_PROVIDER_SITE_OTHER): Payer: BLUE CROSS/BLUE SHIELD | Admitting: Family Medicine

## 2014-12-28 ENCOUNTER — Encounter: Payer: Self-pay | Admitting: Family Medicine

## 2014-12-28 ENCOUNTER — Ambulatory Visit (INDEPENDENT_AMBULATORY_CARE_PROVIDER_SITE_OTHER): Payer: BLUE CROSS/BLUE SHIELD

## 2014-12-28 VITALS — BP 118/83 | HR 87 | Temp 98.0°F | Wt 180.0 lb

## 2014-12-28 DIAGNOSIS — R05 Cough: Secondary | ICD-10-CM | POA: Diagnosis not present

## 2014-12-28 DIAGNOSIS — J209 Acute bronchitis, unspecified: Secondary | ICD-10-CM | POA: Diagnosis not present

## 2014-12-28 DIAGNOSIS — R0989 Other specified symptoms and signs involving the circulatory and respiratory systems: Secondary | ICD-10-CM

## 2014-12-28 DIAGNOSIS — R059 Cough, unspecified: Secondary | ICD-10-CM | POA: Insufficient documentation

## 2014-12-28 MED ORDER — PREDNISONE 10 MG PO TABS: 30.0000 mg | ORAL_TABLET | Freq: Every day | ORAL | Status: DC

## 2014-12-28 MED ORDER — AZITHROMYCIN 250 MG PO TABS: 250.0000 mg | ORAL_TABLET | Freq: Every day | ORAL | Status: DC

## 2014-12-28 MED ORDER — IPRATROPIUM-ALBUTEROL 0.5-2.5 (3) MG/3ML IN SOLN
3.0000 mL | Freq: Once | RESPIRATORY_TRACT | Status: AC
  Administered 2014-12-28: 3 mL via RESPIRATORY_TRACT

## 2014-12-28 MED ORDER — IPRATROPIUM BROMIDE 0.06 % NA SOLN: 2.0000 | Freq: Four times a day (QID) | NASAL | Status: DC

## 2014-12-28 MED ORDER — GUAIFENESIN-CODEINE 100-10 MG/5ML PO SOLN: 5.0000 mL | Freq: Every evening | ORAL | Status: DC | PRN

## 2014-12-28 MED ORDER — BENZONATATE 200 MG PO CAPS: 200.0000 mg | ORAL_CAPSULE | Freq: Three times a day (TID) | ORAL | Status: DC | PRN

## 2014-12-28 NOTE — Assessment & Plan Note (Signed)
Symptoms related to likely viral bronchitis. CXR normal.  Symptoms persistent for almost 1 month. Plan to treat with prednisone, azithromycin, codeine, tessalon, and Atrovent nasal spray.

## 2014-12-28 NOTE — Progress Notes (Signed)
Shane Thomas is a 39 y.o. male who presents to [place]  today for cough x 3-4 weeks. He notes the cough is productive and associated with wheezing and mild shortness of breath. He's had significant coughing interfering with sleep. He denies any vomiting or diarrhea. He does note a posterior nasal drip. He's tried multiple over-the-counter medicines as well as Singulair and Nasonex. He denies severe shortness of breath chest pain or palpitations. He denies a personal history of asthma, and of smoking.   Past Medical History  Diagnosis Date  . Seasonal allergies   . History of melanoma excision    Past Surgical History  Procedure Laterality Date  . Wisdom tooth extraction     Social History  Substance Use Topics  . Smoking status: Never Smoker   . Smokeless tobacco: Current User  . Alcohol Use: 1.2 oz/week    2 Cans of beer per week     Comment: occassionally   family history includes Hypertension in his father.  ROS as above Medications: Current Outpatient Prescriptions  Medication Sig Dispense Refill  . diphenhydrAMINE (BENADRYL) 25 MG tablet Take 1 tablet (25 mg total) by mouth every 6 (six) hours. 20 tablet 0  . mometasone (NASONEX) 50 MCG/ACT nasal spray Place 2 sprays into the nose 2 (two) times daily. 1 or 2 sprays in each nostril twice daily 17 g 12  . montelukast (SINGULAIR) 10 MG tablet Take 1 tablet (10 mg total) by mouth at bedtime. 90 tablet 3  . triamcinolone cream (KENALOG) 0.1 % Apply 1 application topically 2 (two) times daily. 30 g 0  . azithromycin (ZITHROMAX) 250 MG tablet Take 1 tablet (250 mg total) by mouth daily. Take first 2 tablets together, then 1 every day until finished. 6 tablet 0  . benzonatate (TESSALON) 200 MG capsule Take 1 capsule (200 mg total) by mouth 3 (three) times daily as needed for cough. 45 capsule 0  . guaiFENesin-codeine 100-10 MG/5ML syrup Take 5 mLs by mouth at bedtime as needed for cough. 120 mL 0   . ipratropium (ATROVENT) 0.06 % nasal spray Place 2 sprays into both nostrils 4 (four) times daily. 15 mL 1  . predniSONE (DELTASONE) 10 MG tablet Take 3 tablets (30 mg total) by mouth daily. 15 tablet 0   Current Facility-Administered Medications  Medication Dose Route Frequency Provider Last Rate Last Dose  . ipratropium-albuterol (DUONEB) 0.5-2.5 (3) MG/3ML nebulizer solution 3 mL  3 mL Nebulization Once Gregor Hams, MD       No Known Allergies   Exam:  BP 118/83 mmHg  Pulse 87  Temp(Src) 98 F (36.7 C) (Oral)  Wt 180 lb (81.647 kg)  SpO2 95% Gen: Well NAD nontoxic appearing  HEENT: EOMI,  MMM clear nasal discharge. Posterior pharynx with cobblestoning. Normal tympanic membranes bilaterally. No significant cervical lymphadenopathy. Lungs: Normal work of breathing.Coarse breath sounds and wheezing present bilaterally  Heart: RRR no MRG Abd: NABS, Soft. Nondistended, Nontender Exts: Brisk capillary refill, warm and well perfused.   Patient was given a 2.5/0.5 mg DuoNeb nebulizer treatment, and felt a bit better.   CXR prelim result: Bronchitic pattern. No consolidation noted. Awaiting form radiology read.   No results found for this or any previous visit (from the past 24 hour(s)). Dg Chest 2 View  12/28/2014  CLINICAL DATA:  Cough and chest congestion for the past 3 weeks, history of melanoma and seasonal allergies. EXAM: CHEST  2 VIEW COMPARISON:  PA and  lateral chest x-ray of April 04, 2011. FINDINGS: The lungs are adequately inflated and clear. There is no alveolar infiltrate. There are no abnormal masses. The heart and pulmonary vascularity are normal. The mediastinum is normal in width. There is no pleural effusion. The bony thorax is unremarkable. IMPRESSION: There is no active cardiopulmonary disease. Electronically Signed   By: David  Martinique M.D.   On: 12/28/2014 08:49     Please see individual assessment and plan sections.

## 2014-12-28 NOTE — Progress Notes (Signed)
Quick Note:  Normal, no changes. ______ 

## 2014-12-28 NOTE — Patient Instructions (Signed)
Thank you for coming in today. Call or go to the emergency room if you get worse, have trouble breathing, have chest pains, or palpitations.     Acute Bronchitis Bronchitis is inflammation of the airways that extend from the windpipe into the lungs (bronchi). The inflammation often causes mucus to develop. This leads to a cough, which is the most common symptom of bronchitis.  In acute bronchitis, the condition usually develops suddenly and goes away over time, usually in a couple weeks. Smoking, allergies, and asthma can make bronchitis worse. Repeated episodes of bronchitis may cause further lung problems.  CAUSES Acute bronchitis is most often caused by the same virus that causes a cold. The virus can spread from person to person (contagious) through coughing, sneezing, and touching contaminated objects. SIGNS AND SYMPTOMS   Cough.   Fever.   Coughing up mucus.   Body aches.   Chest congestion.   Chills.   Shortness of breath.   Sore throat.  DIAGNOSIS  Acute bronchitis is usually diagnosed through a physical exam. Your health care provider will also ask you questions about your medical history. Tests, such as chest X-rays, are sometimes done to rule out other conditions.  TREATMENT  Acute bronchitis usually goes away in a couple weeks. Oftentimes, no medical treatment is necessary. Medicines are sometimes given for relief of fever or cough. Antibiotic medicines are usually not needed but may be prescribed in certain situations. In some cases, an inhaler may be recommended to help reduce shortness of breath and control the cough. A cool mist vaporizer may also be used to help thin bronchial secretions and make it easier to clear the chest.  HOME CARE INSTRUCTIONS  Get plenty of rest.   Drink enough fluids to keep your urine clear or pale yellow (unless you have a medical condition that requires fluid restriction). Increasing fluids may help thin your respiratory  secretions (sputum) and reduce chest congestion, and it will prevent dehydration.   Take medicines only as directed by your health care provider.  If you were prescribed an antibiotic medicine, finish it all even if you start to feel better.  Avoid smoking and secondhand smoke. Exposure to cigarette smoke or irritating chemicals will make bronchitis worse. If you are a smoker, consider using nicotine gum or skin patches to help control withdrawal symptoms. Quitting smoking will help your lungs heal faster.   Reduce the chances of another bout of acute bronchitis by washing your hands frequently, avoiding people with cold symptoms, and trying not to touch your hands to your mouth, nose, or eyes.   Keep all follow-up visits as directed by your health care provider.  SEEK MEDICAL CARE IF: Your symptoms do not improve after 1 week of treatment.  SEEK IMMEDIATE MEDICAL CARE IF:  You develop an increased fever or chills.   You have chest pain.   You have severe shortness of breath.  You have bloody sputum.   You develop dehydration.  You faint or repeatedly feel like you are going to pass out.  You develop repeated vomiting.  You develop a severe headache. MAKE SURE YOU:   Understand these instructions.  Will watch your condition.  Will get help right away if you are not doing well or get worse.   This information is not intended to replace advice given to you by your health care provider. Make sure you discuss any questions you have with your health care provider.   Document Released: 02/22/2004 Document Revised: 02/04/2014   Document Reviewed: 07/07/2012 Elsevier Interactive Patient Education 2016 [employer] Inc[employer]  

## 2015-01-06 ENCOUNTER — Emergency Department (INDEPENDENT_AMBULATORY_CARE_PROVIDER_SITE_OTHER)
Admission: EM | Admit: 2015-01-06 | Discharge: 2015-01-06 | Disposition: A | Discharge status: Home / Self Care | Payer: BLUE CROSS/BLUE SHIELD | Source: Home / Self Care | Attending: Family Medicine | Admitting: Family Medicine

## 2015-01-06 DIAGNOSIS — J9801 Acute bronchospasm: Secondary | ICD-10-CM | POA: Diagnosis not present

## 2015-01-06 MED ORDER — PREDNISONE 20 MG PO TABS: 20.0000 mg | ORAL_TABLET | Freq: Two times a day (BID) | ORAL | Status: DC

## 2015-01-06 MED ORDER — FLUTICASONE-SALMETEROL 100-50 MCG/DOSE IN AEPB: 1.0000 | INHALATION_SPRAY | Freq: Two times a day (BID) | RESPIRATORY_TRACT | Status: DC

## 2015-01-06 NOTE — Discharge Instructions (Signed)
Bronchospasm, Adult  A bronchospasm is a spasm or tightening of the airways going into the lungs. During a bronchospasm breathing becomes more difficult because the airways get smaller. When this happens there can be coughing, a whistling sound when breathing (wheezing), and difficulty breathing. Bronchospasm is often associated with asthma, but not all patients who experience a bronchospasm have asthma.  CAUSES   A bronchospasm is caused by inflammation or irritation of the airways. The inflammation or irritation may be triggered by:   · Allergies (such as to animals, pollen, food, or mold). Allergens that cause bronchospasm may cause wheezing immediately after exposure or many hours later.    · Infection. Viral infections are believed to be the most common cause of bronchospasm.    · Exercise.    · Irritants (such as pollution, cigarette smoke, strong odors, aerosol sprays, and paint fumes).    · Weather changes. Winds increase molds and pollens in the air. Rain refreshes the air by washing irritants out. Cold air may cause inflammation.    · Stress and emotional upset.    SIGNS AND SYMPTOMS   · Wheezing.    · Excessive nighttime coughing.    · Frequent or severe coughing with a simple cold.    · Chest tightness.    · Shortness of breath.    DIAGNOSIS   Bronchospasm is usually diagnosed through a history and physical exam. Tests, such as chest X-rays, are sometimes done to look for other conditions.  TREATMENT   · Inhaled medicines can be given to open up your airways and help you breathe. The medicines can be given using either an inhaler or a nebulizer machine.  · Corticosteroid medicines may be given for severe bronchospasm, usually when it is associated with asthma.  HOME CARE INSTRUCTIONS   · Always have a plan prepared for seeking medical care. Know when to call your health care provider and local emergency services (911 in the [place] Know where you can access local emergency care.  · Only take medicines as  directed by your health care provider.  · If you were prescribed an inhaler or nebulizer machine, ask your health care provider to explain how to use it correctly. Always use a spacer with your inhaler if you were given one.  · It is necessary to remain calm during an attack. Try to relax and breathe more slowly.   · Control your home environment in the following ways:      Change your heating and air conditioning filter at least once a month.      Limit your use of fireplaces and wood stoves.    Do not smoke and do not allow smoking in your home.      Avoid exposure to perfumes and fragrances.      Get rid of pests (such as roaches and mice) and their droppings.      Throw away plants if you see mold on them.      Keep your house clean and dust free.      Replace carpet with wood, tile, or vinyl flooring. Carpet can trap dander and dust.      Use allergy-proof pillows, mattress covers, and box spring covers.      Wash bed sheets and blankets every week in hot water and dry them in a dryer.      Use blankets that are made of polyester or cotton.      Wash hands frequently.  SEEK MEDICAL CARE IF:   · You have muscle aches.    · You have chest pain.    · The sputum changes from clear or   white to yellow, green, gray, or bloody.    · The sputum you cough up gets thicker.    · There are problems that may be related to the medicine you are given, such as a rash, itching, swelling, or trouble breathing.    SEEK IMMEDIATE MEDICAL CARE IF:   · You have worsening wheezing and coughing even after taking your prescribed medicines.    · You have increased difficulty breathing.    · You develop severe chest pain.  MAKE SURE YOU:   · Understand these instructions.  · Will watch your condition.  · Will get help right away if you are not doing well or get worse.     This information is not intended to replace advice given to you by your health care provider. Make sure you discuss any questions you have with your health care  provider.     Document Released: 01/17/2003 Document Revised: 02/04/2014 Document Reviewed: 07/06/2012  Elsevier Interactive Patient Education ©2016 [employer] Inc[employer]

## 2015-01-06 NOTE — ED Provider Notes (Signed)
CSN: [id number]     Arrival date & time 01/06/15  1326 History   None    Chief Complaint  Patient presents with  . Cough      HPI Comments: Patient was treated for bronchitis two weeks ago with prednisone, azithromycin, codeine, Tessalon, and Atrovent nasal spray.  He improved until 10am this morning when he felt as if he could not get a good breath of air.  The history is provided by the patient.    Past Medical History  Diagnosis Date  . Seasonal allergies   . History of melanoma excision    Past Surgical History  Procedure Laterality Date  . Wisdom tooth extraction     Family History  Problem Relation Age of Onset  . Hypertension Father     Social History  Substance Use Topics  . Smoking status: Never Smoker   . Smokeless tobacco: Current User  . Alcohol Use: 1.2 oz/week    2 Cans of beer per week     Comment: occassionally    Review of Systems No sore throat + cough + sneezing No pleuritic pain, but feels tightness in anterior chest + wheezing + nasal congestion + post-nasal drainage No sinus pain/pressure No itchy/red eyes No earache No hemoptysis + SOB with activity No fever/chills No nausea No vomiting No abdominal pain No diarrhea No urinary symptoms No skin rash No fatigue No myalgias + headache Used OTC meds without relief  Allergies  Review of patient's allergies indicates no known allergies.  Home Medications   Prior to Admission medications   Medication Sig Start Date End Date Taking? Authorizing Provider  azithromycin (ZITHROMAX) 250 MG tablet Take 1 tablet (250 mg total) by mouth daily. Take first 2 tablets together, then 1 every day until finished. 12/28/14   Gregor Hams, MD  benzonatate (TESSALON) 200 MG capsule Take 1 capsule (200 mg total) by mouth 3 (three) times daily as needed for cough. 12/28/14   Gregor Hams, MD  diphenhydrAMINE (BENADRYL) 25 MG tablet Take 1 tablet (25 mg total) by mouth every 6 (six) hours. 09/20/14   Noland Fordyce, PA-C  Fluticasone-Salmeterol (ADVAIR DISKUS) 100-50 MCG/DOSE AEPB Inhale 1 puff into the lungs 2 (two) times daily. 01/06/15   Kandra Nicolas, MD  guaiFENesin-codeine 100-10 MG/5ML syrup Take 5 mLs by mouth at bedtime as needed for cough. 12/28/14   Gregor Hams, MD  ipratropium (ATROVENT) 0.06 % nasal spray Place 2 sprays into both nostrils 4 (four) times daily. 12/28/14   Gregor Hams, MD  mometasone (NASONEX) 50 MCG/ACT nasal spray Place 2 sprays into the nose 2 (two) times daily. 1 or 2 sprays in each nostril twice daily 09/01/14   Gregor Hams, MD  montelukast (SINGULAIR) 10 MG tablet Take 1 tablet (10 mg total) by mouth at bedtime. 09/01/14   Gregor Hams, MD  predniSONE (DELTASONE) 20 MG tablet Take 1 tablet (20 mg total) by mouth 2 (two) times daily. Take with food. 01/06/15   Kandra Nicolas, MD  triamcinolone cream (KENALOG) 0.1 % Apply 1 application topically 2 (two) times daily. 09/20/14   Noland Fordyce, PA-C   Meds Ordered and Administered this Visit  Medications - No data to display  BP 129/80 mmHg  Pulse 84  Temp(Src) 97.8 F (36.6 C) (Oral)  Ht 5\' 11"  (1.803 m)  Wt 183 lb 12 oz (83.348 kg)  BMI 25.64 kg/m2  SpO2 94% No data found.   Physical Exam Nursing notes and Vital Signs reviewed. Appearance:  Patient appears stated age, and in no acute distress Eyes:  Pupils are equal, round, and reactive to light and accomodation.  Extraocular movement is intact.  Conjunctivae are not inflamed  Ears:  Canals normal.  Tympanic membranes normal.  Nose:  Mildly congested turbinates.  No sinus tenderness.    Pharynx:  Normal Neck:  Supple.  No adenopathy Lungs:  Faint bilateral expiratory wheezes  posteriorly.  Breath sounds are equal.  Moving air well. Heart:  Regular rate and rhythm without murmurs, rubs, or gallops.  Abdomen:  Nontender without masses or hepatosplenomegaly.  Bowel sounds are present.  No CVA or flank tenderness.  Extremities:  No edema.  No calf tenderness Skin:  No rash present.   Procedures  None    MDM   1. Bronchospasm    Begin prednisone burst.  Begin Advair Followup with Family Doctor if not improved in two weeks     Kandra Nicolas, MD 01/12/15 [phone]

## 2015-01-06 NOTE — ED Notes (Signed)
Saw Dr. Georgina Snell 12/28/14, dx with bronchitis.  Feeling better until 10 am this morning, has tightness in chest, feels like he can not get a deep breath.  Coughing to the point of gagging at times.

## 2015-03-28 ENCOUNTER — Encounter: Payer: Self-pay | Admitting: *Deleted

## 2015-03-28 ENCOUNTER — Emergency Department (INDEPENDENT_AMBULATORY_CARE_PROVIDER_SITE_OTHER): Payer: BLUE CROSS/BLUE SHIELD

## 2015-03-28 ENCOUNTER — Emergency Department (INDEPENDENT_AMBULATORY_CARE_PROVIDER_SITE_OTHER)
Admission: EM | Admit: 2015-03-28 | Discharge: 2015-03-28 | Disposition: A | Discharge status: Home / Self Care | Payer: BLUE CROSS/BLUE SHIELD | Source: Home / Self Care | Attending: Family Medicine | Admitting: Family Medicine

## 2015-03-28 DIAGNOSIS — J302 Other seasonal allergic rhinitis: Secondary | ICD-10-CM

## 2015-03-28 DIAGNOSIS — J9801 Acute bronchospasm: Secondary | ICD-10-CM | POA: Diagnosis not present

## 2015-03-28 DIAGNOSIS — J309 Allergic rhinitis, unspecified: Secondary | ICD-10-CM

## 2015-03-28 DIAGNOSIS — J3089 Other allergic rhinitis: Principal | ICD-10-CM

## 2015-03-28 DIAGNOSIS — K449 Diaphragmatic hernia without obstruction or gangrene: Secondary | ICD-10-CM

## 2015-03-28 MED ORDER — MONTELUKAST SODIUM 10 MG PO TABS: 10.0000 mg | ORAL_TABLET | Freq: Every day | ORAL | Status: DC

## 2015-03-28 MED ORDER — PREDNISONE 20 MG PO TABS: 20.0000 mg | ORAL_TABLET | Freq: Two times a day (BID) | ORAL | Status: DC

## 2015-03-28 MED ORDER — BECLOMETHASONE DIPROPIONATE 40 MCG/ACT IN AERS: 2.0000 | INHALATION_SPRAY | Freq: Two times a day (BID) | RESPIRATORY_TRACT | Status: DC

## 2015-03-28 MED ORDER — METHYLPREDNISOLONE SODIUM SUCC 125 MG IJ SOLR
125.0000 mg | Freq: Once | INTRAMUSCULAR | Status: AC
  Administered 2015-03-28: 125 mg via INTRAMUSCULAR

## 2015-03-28 NOTE — Discharge Instructions (Signed)
Begin prednisone Wednesday 03/29/15.   May add Pseudoephedrine (30mg , one or two every 4 to 6 hours) for sinus congestion.    May use Afrin nasal spray (or generic oxymetazoline) once daily for about 5 days and then discontinue.  Also recommend using saline nasal spray several times daily and saline nasal irrigation (AYR is a common brand).  Use Nasonex nasal spray each morning after using Afrin nasal spray and saline nasal irrigation. Begin daily antihistamine such as Zyrtec, Claritin, or Allegra, and rotate monthly.

## 2015-03-28 NOTE — ED Provider Notes (Signed)
CSN: [id number]     Arrival date & time 03/28/15  1812 History   First MD Initiated Contact with Patient 03/28/15 1934     Chief Complaint  Patient presents with  . Cough      HPI Comments: Patient was treated for bronchitis in December.  He has a history of seasonal rhinitis, and complains of persistent sinus congestion, intermittent wheezing, and shortness of breath.  He denies fevers, chills, and sweats.  The history is provided by the patient.    Past Medical History  Diagnosis Date  . Seasonal allergies   . History of melanoma excision    Past Surgical History  Procedure Laterality Date  . Wisdom tooth extraction     Family History  Problem Relation Age of Onset  . Hypertension Father    Social History  Substance Use Topics  . Smoking status: Never Smoker   . Smokeless tobacco: Current User  . Alcohol Use: 1.2 oz/week    2 Cans of beer per week     Comment: occassionally    Review of Systems No sore throat + cough + sneezing No pleuritic pain but has chest tightness + wheezing + nasal congestion + post-nasal drainage No sinus pain/pressure No itchy/red eyes No earache No hemoptysis + SOB No fever/chills No nausea No vomiting No abdominal pain No diarrhea No urinary symptoms No skin rash + fatigue No myalgias No headache Used OTC meds without relief  Allergies  Review of patient's allergies indicates no known allergies.  Home Medications   Prior to Admission medications   Medication Sig Start Date End Date Taking? Authorizing Provider  beclomethasone (QVAR) 40 MCG/ACT inhaler Inhale 2 puffs into the lungs 2 (two) times daily. 03/28/15   Kandra Nicolas, MD  ipratropium (ATROVENT) 0.06 % nasal spray Place 2 sprays into both nostrils 4 (four) times daily. 12/28/14   Gregor Hams, MD  mometasone (NASONEX) 50 MCG/ACT nasal spray Place 2 sprays into the nose 2 (two) times daily. 1 or 2 sprays in each nostril twice daily 09/01/14   Gregor Hams, MD   montelukast (SINGULAIR) 10 MG tablet Take 1 tablet (10 mg total) by mouth at bedtime. 03/28/15   Kandra Nicolas, MD  predniSONE (DELTASONE) 20 MG tablet Take 1 tablet (20 mg total) by mouth 2 (two) times daily. Take one tab 2 times daily for 5 days, then one daily for 4 days. Take with food. 03/28/15   Kandra Nicolas, MD  triamcinolone cream (KENALOG) 0.1 % Apply 1 application topically 2 (two) times daily. 09/20/14   Noland Fordyce, PA-C   Meds Ordered and Administered this Visit   Medications  methylPREDNISolone sodium succinate (SOLU-MEDROL) 125 mg/2 mL injection 125 mg (125 mg Intramuscular Given 03/28/15 1957)    BP 131/81 mmHg  Pulse 109  Temp(Src) 98.1 F (36.7 C) (Oral)  Resp 18  Ht 5\' 11"  (1.803 m)  Wt 183 lb (83.008 kg)  BMI 25.53 kg/m2  SpO2 91% No data found.   Physical Exam Nursing notes and Vital Signs reviewed. Appearance:  Patient appears stated age, and in no acute distress Eyes:  Pupils are equal, round, and reactive to light and accomodation.  Extraocular movement is intact.  Conjunctivae are not inflamed  Ears:  Canals normal.  Tympanic membranes normal.  Nose:  Congested turbinates with mucoid discharge.  No sinus tenderness.   Pharynx:  Normal Neck:  Supple.  No adenopaty Lungs:  Clear to auscultation.  Breath sounds are equal.  Moving air well. Heart:  Regular rate and rhythm without murmurs, rubs, or gallops.  Abdomen:  Nontender without masses or hepatosplenomegaly.  Bowel sounds are present.  No CVA or flank tenderness.  Extremities:  No edema.  Skin:  No rash present.   ED Course  Procedures  None  Imaging Review Dg Chest 2 View  03/28/2015  CLINICAL DATA:  Shortness of breath and chest pain EXAM: CHEST  2 VIEW COMPARISON:  August 27, 2014 FINDINGS: There is evidence of a hiatal hernia. There is no edema or consolidation. Heart size and pulmonary vascularity are normal. No adenopathy. No pneumothorax. No bone lesions. IMPRESSION: Evidence of a hiatal  hernia.  No edema or consolidation. Electronically Signed   By: Lowella Grip III M.D.   On: 03/28/2015 18:53      MDM   1. Perennial allergic rhinitis with seasonal variation   2. Bronchospasm    Administered SoluMedrol 125mg  IM.  Begin prednisone burst and taper. Begin QVAR. Begin prednisone Wednesday 03/29/15.   May add Pseudoephedrine (30mg , one or two every 4 to 6 hours) for sinus congestion.    May use Afrin nasal spray (or generic oxymetazoline) once daily for about 5 days and then discontinue.  Also recommend using saline nasal spray several times daily and saline nasal irrigation (AYR is a common brand).  Use Nasonex nasal spray each morning after using Afrin nasal spray and saline nasal irrigation. Begin daily antihistamine such as Zyrtec, Claritin, or Allegra, and rotate monthly. Followup with Family Doctor if not improved in two weeks.    Kandra Nicolas, MD 04/03/15 [phone]

## 2015-03-28 NOTE — ED Notes (Signed)
Pt c/o cough, SOB and sneezing x today, but feels like his s/s have not completely resolved from his visit 12/16'. Denies fever.

## 2015-05-18 ENCOUNTER — Telehealth: Payer: Self-pay | Admitting: Family Medicine

## 2015-05-18 MED ORDER — FLUTICASONE FUROATE 100 MCG/ACT IN AEPB: 1.0000 | INHALATION_SPRAY | Freq: Every day | RESPIRATORY_TRACT | Status: DC

## 2015-05-18 NOTE — Telephone Encounter (Signed)
Will try Arnuity device

## 2015-05-18 NOTE — Telephone Encounter (Signed)
Patient called adv that he needs a new inhaler he is completely out of the one given (Qvar) but he does not want Qvar because insurance would no cover and it cost him almost $300.00. Thanks

## 2015-05-19 NOTE — Telephone Encounter (Signed)
Pt.notified

## 2015-06-27 ENCOUNTER — Ambulatory Visit (INDEPENDENT_AMBULATORY_CARE_PROVIDER_SITE_OTHER): Payer: BLUE CROSS/BLUE SHIELD | Admitting: Family Medicine

## 2015-06-27 DIAGNOSIS — Z5329 Procedure and treatment not carried out because of patient's decision for other reasons: Secondary | ICD-10-CM

## 2015-06-27 NOTE — Progress Notes (Signed)
No show. F/u soon

## 2015-08-07 ENCOUNTER — Ambulatory Visit: Payer: BLUE CROSS/BLUE SHIELD | Admitting: Family Medicine

## 2015-08-09 ENCOUNTER — Ambulatory Visit (INDEPENDENT_AMBULATORY_CARE_PROVIDER_SITE_OTHER): Payer: BLUE CROSS/BLUE SHIELD | Admitting: Family Medicine

## 2015-08-09 ENCOUNTER — Encounter: Payer: Self-pay | Admitting: Family Medicine

## 2015-08-09 VITALS — BP 122/81 | HR 80 | Wt 188.0 lb

## 2015-08-09 DIAGNOSIS — R5383 Other fatigue: Secondary | ICD-10-CM | POA: Diagnosis not present

## 2015-08-09 DIAGNOSIS — R6882 Decreased libido: Secondary | ICD-10-CM

## 2015-08-09 NOTE — Progress Notes (Signed)
       Rachid Hlavaty is a 40 y.o. male who presents to [facility] today for fatigue and decreased libido. Patient notes a several month history of decreasing libido and feeling fatigued. He denies any depression or anhedonia. He denies any erectile dysfunction as well. He notes his father has a history of low testosterone and is worried that he may have a testosterone problem. No fevers chills nausea vomiting or diarrhea.   Past Medical History  Diagnosis Date  . Seasonal allergies   . History of melanoma excision    Past Surgical History  Procedure Laterality Date  . Wisdom tooth extraction     Social History  Substance Use Topics  . Smoking status: Never Smoker   . Smokeless tobacco: Current User  . Alcohol Use: 1.2 oz/week    2 Cans of beer per week     Comment: occassionally   family history includes Hypertension in his father.  ROS as above:  Medications: Current Outpatient Prescriptions  Medication Sig Dispense Refill  . Fluticasone Furoate (ARNUITY ELLIPTA) 100 MCG/ACT AEPB Inhale 1 puff into the lungs daily. 30 each 12  . loratadine (CLARITIN) 10 MG tablet Take 10 mg by mouth daily.    . mometasone (NASONEX) 50 MCG/ACT nasal spray Place 2 sprays into the nose 2 (two) times daily. 1 or 2 sprays in each nostril twice daily 17 g 12  . montelukast (SINGULAIR) 10 MG tablet Take 1 tablet (10 mg total) by mouth at bedtime. 30 tablet 2  . triamcinolone cream (KENALOG) 0.1 % Apply 1 application topically 2 (two) times daily. 30 g 0  . [DISCONTINUED] Fluticasone-Salmeterol (ADVAIR DISKUS) 100-50 MCG/DOSE AEPB Inhale 1 puff into the lungs 2 (two) times daily. 60 each 0   No current facility-administered medications for this visit.   No Known Allergies   Exam:  BP 122/81 mmHg  Pulse 80  Wt 188 lb (85.276 kg)  SpO2 96% Gen: Well NAD HEENT: EOMI,  MMM Lungs:  Normal work of breathing. CTABL Heart: RRR no MRG Abd: NABS, Soft. Nondistended, Nontender Exts: Brisk capillary refill, warm and well perfused.  Psych: Alert and oriented processandaffect.  Depression screen [facility] 2/9 08/09/2015  Decreased Interest 0  Down, Depressed, Hopeless 0  PHQ - 2 Score 0      No results found for this or any previous visit (from the past 24 hour(s)). No results found.    Assessment and Plan: 40 y.o. male with   Fatigue and decreased libido. Concerning for low testosterone. Check basic fasting labs including testosterone and TSH lipids cholesterol and metabolic panel.  Additionally we'll check PSA preparation for possible testosterone therapy.  Discussed warning signs or symptoms. Please see discharge instructions. Patient expresses understanding.

## 2015-08-09 NOTE — Patient Instructions (Signed)
Thank you for coming in today. Get morning fasting labs soon.  We will check testosterone.   Testosterone Testosterone is a hormone made by the male's testicles and by the adrenal glands, which are a pair of glands on top of the kidneys. Starting at puberty, testosterone stimulates the development of secondary sex characteristics. This includes a deeper voice, growth of muscles and body hair, and penis enlargement.  Females also produce testosterone in both the adrenal glands and ovaries. A male's body converts testosterone into estradiol, the main male sex hormone. An abnormal level of testosterone can cause health issues in both males and females. You may have this test if your health care provider suspects that an abnormal testosterone level is causing or contributing to other health problems. In males, symptoms of an abnormal testosterone level include:  Infertility.  Erectile dysfunction.  Delayed puberty or premature puberty. In females, symptoms of an abnormally high testosterone level include:  Infertility.  Polycystic ovarian syndrome (PCOS).  Developing masculine features (virilization). This test requires a blood sample taken from a vein in your arm or hand. The sample for this test is usually collected in the morning. The amount of testosterone in your blood is highest at that time. RESULTS It is your responsibility to obtain your test results. Ask the lab or department performing the test when and how you will get your results. Contact your health care provider to discuss any questions you have about your results.  The result of a blood test for testosterone will be given as a range of values. A testosterone level that is outside the normal range may indicate a health problem. Testosterone is measured in nanograms per deciliter (ng/dL). Range of Normal Values Ranges for normal values may vary among different labs and hospitals. You should always check with your health care  provider after having lab work or other tests done to discuss whether your values are considered within normal limits. Normal levels of total testosterone are as follows:  Male:  7 months to 40 years old: less than 30 ng/dL.  40-62 years old: less than 300 ng/dL.  20-51 years old: 170-540 ng/dL.  70-50 years old: 250-910 ng/dL.  40 years old and over: 280-1,080 ng/dL.  Male:  7 months to 40 years old: less than 30 ng/dL.  23-47 years old: less than 40 ng/dL.  58-68 years old: less than 60 ng/dL.  67-66 years old: less than 70 ng/dL.  40 years old and over: less than 70 ng/dL. Meaning of Results Outside Normal Value Ranges A testosterone level that is too low or too high can indicate a number of health problems. In males:  A high testosterone level can occur if you:  Have certain types of tumors.  Have an overactive thyroid gland (hyperthyroidism).  Use anabolic steroids.  Are starting puberty early (precocious puberty).  Have an inherited disorder that affects the adrenal glands (congenital adrenal hyperplasia).  A low testosterone level can occur if you:  Have certain genetic diseases.  Have had certain viral infections, such as mumps.  Have pituitary disease.  Have had an injury to the testicles.  Are an alcoholic. In females:  A high testosterone level can occur if you have:  Certain types of tumors.  An inherited disorder that affects certain cells in the adrenal glands (congenital adrenocortical hyperplasia).  PCOS.  A low testosterone level does not cause health problems. Discuss the results of your testosterone test with your health care provider. Your health care provider  will use the results of this test and other tests to make a diagnosis.   This information is not intended to replace advice given to you by your health care provider. Make sure you discuss any questions you have with your health care provider.   Document Released:  02/01/2004 Document Revised: 02/04/2014 Document Reviewed: 05/12/2013 Elsevier Interactive Patient Education [employer].

## 2015-08-28 ENCOUNTER — Other Ambulatory Visit: Payer: Self-pay | Admitting: Family Medicine

## 2015-09-28 ENCOUNTER — Other Ambulatory Visit: Payer: Self-pay | Admitting: Family Medicine

## 2015-09-29 ENCOUNTER — Other Ambulatory Visit: Payer: Self-pay | Admitting: Emergency Medicine

## 2015-10-28 ENCOUNTER — Other Ambulatory Visit: Payer: Self-pay | Admitting: Family Medicine

## 2015-11-01 ENCOUNTER — Other Ambulatory Visit: Payer: Self-pay

## 2015-11-01 MED ORDER — MOMETASONE FUROATE 50 MCG/ACT NA SUSP: 2.0000 | Freq: Two times a day (BID) | NASAL | 12 refills | Status: DC

## 2015-11-01 MED ORDER — LORATADINE 10 MG PO TABS: 10.0000 mg | ORAL_TABLET | Freq: Every day | ORAL | 6 refills | Status: DC

## 2015-11-01 MED ORDER — MONTELUKAST SODIUM 10 MG PO TABS
10.0000 mg | ORAL_TABLET | Freq: Every day | ORAL | 6 refills | Status: DC
Start: 2015-11-01 — End: 2016-08-18

## 2016-01-17 ENCOUNTER — Other Ambulatory Visit: Payer: Self-pay | Admitting: Family Medicine

## 2016-02-01 ENCOUNTER — Other Ambulatory Visit: Payer: Self-pay | Admitting: Family Medicine

## 2016-03-14 ENCOUNTER — Encounter: Payer: Self-pay | Admitting: Family Medicine

## 2016-03-14 ENCOUNTER — Ambulatory Visit (INDEPENDENT_AMBULATORY_CARE_PROVIDER_SITE_OTHER): Payer: BLUE CROSS/BLUE SHIELD | Admitting: Family Medicine

## 2016-03-14 ENCOUNTER — Telehealth: Payer: Self-pay

## 2016-03-14 VITALS — BP 128/83 | HR 80 | Temp 97.6°F | Wt 186.0 lb

## 2016-03-14 DIAGNOSIS — R059 Cough, unspecified: Secondary | ICD-10-CM

## 2016-03-14 DIAGNOSIS — R5383 Other fatigue: Secondary | ICD-10-CM | POA: Diagnosis not present

## 2016-03-14 DIAGNOSIS — R062 Wheezing: Secondary | ICD-10-CM | POA: Diagnosis not present

## 2016-03-14 DIAGNOSIS — R05 Cough: Secondary | ICD-10-CM

## 2016-03-14 DIAGNOSIS — J3089 Other allergic rhinitis: Secondary | ICD-10-CM | POA: Diagnosis not present

## 2016-03-14 DIAGNOSIS — R6882 Decreased libido: Secondary | ICD-10-CM

## 2016-03-14 MED ORDER — BUDESONIDE-FORMOTEROL FUMARATE 80-4.5 MCG/ACT IN AERO: 2.0000 | INHALATION_SPRAY | Freq: Two times a day (BID) | RESPIRATORY_TRACT | 12 refills | Status: DC

## 2016-03-14 MED ORDER — AZELASTINE HCL 0.1 % NA SOLN: 2.0000 | Freq: Two times a day (BID) | NASAL | 12 refills | Status: DC

## 2016-03-14 NOTE — Telephone Encounter (Signed)
There may be.  Can you ask your insurance company or pharmacist which once of these types of inhalers would be cheapest for you. Generally I can keep trying to prescribe different things but you insurer will know more than I.

## 2016-03-14 NOTE — Progress Notes (Signed)
Pt stated that he has had a cough for a while.  He has been taking allergy medication and nose spray, but they are not working per patient.  Pt stated that he also seems to be wheezing at night.

## 2016-03-14 NOTE — Patient Instructions (Signed)
Thank you for coming in today. Return in a few months.  Get morning fasting labs soon.  Use symbicort twice daily.  Add Astelin nasal spray.   You should hear from the allergist soon.

## 2016-03-15 NOTE — Progress Notes (Signed)
Shane Thomas is a 41 y.o. male who presents to [place]: [place] today for cough nasal congestion and postnasal drainage. Symptoms present for 2 weeks. Patient notes nasal congestion and drainage has been ongoing for years now. The cough is nonproductive but is bothersome. He's been treating his cough with Robitussin and DayQuil which help a bit. He also notes some wheezing as well. He has been treating his nasal congestion with Claritin and Nasonex and Singulair which he felt only a little. He notes that he no longer is using the fluticasone inhaler   Magda Paganini patient notes continued fatigue and decreased libido. He is concerned he may have low testosterone. He would like his testosterone levels checked if possible.  Past Medical History:  Diagnosis Date  . History of melanoma excision   . Seasonal allergies    Past Surgical History:  Procedure Laterality Date  . WISDOM TOOTH EXTRACTION     Social History  Substance Use Topics  . Smoking status: Never Smoker  . Smokeless tobacco: Current User  . Alcohol use 1.2 oz/week    2 Cans of beer per week     Comment: occassionally   family history includes Hypertension in his father.  ROS as above:  Medications: Current Outpatient Prescriptions  Medication Sig Dispense Refill  . azelastine (ASTELIN) 0.1 % nasal spray Place 2 sprays into both nostrils 2 (two) times daily. Use in each nostril as directed 30 mL 12  . budesonide-formoterol (SYMBICORT) 80-4.5 MCG/ACT inhaler Inhale 2 puffs into the lungs 2 (two) times daily. 1 Inhaler 12  . loratadine (CLARITIN) 10 MG tablet Take 1 tablet (10 mg total) by mouth daily. 30 tablet 6  . mometasone (NASONEX) 50 MCG/ACT nasal spray Place 2 sprays into the nose 2 (two) times daily. 1 or 2 sprays in each nostril twice daily 17 g 12  . montelukast (SINGULAIR) 10 MG tablet Take 1 tablet (10  mg total) by mouth at bedtime. 30 tablet 6  . triamcinolone cream (KENALOG) 0.1 % Apply 1 application topically 2 (two) times daily. 30 g 0   No current facility-administered medications for this visit.    No Known Allergies  Health Maintenance Health Maintenance  Topic Date Due  . HIV Screening  06/27/1990  . TETANUS/TDAP  06/27/1994  . INFLUENZA VACCINE  08/29/2015     Exam:  BP 128/83 (BP Location: Right Arm, Patient Position: Sitting, Cuff Size: Normal)   Pulse 80   Temp 97.6 F (36.4 C) (Oral)   Wt 186 lb (84.4 kg)   SpO2 98%   BMI 25.94 kg/m  Gen: Well NAD HEENT: EOMI,  MMM Clear nasal discharge. Posterior pharynx with cobblestoning. Inflamed nasal turbinates bilaterally. Lungs: Normal work of breathing. CTABL Heart: RRR no MRG Abd: NABS, Soft. Nondistended, Nontender Exts: Brisk capillary refill, warm and well perfused.    No results found for this or any previous visit (from the past 72 hour(s)). No results found.    Assessment and Plan: 41 y.o. male with  Cough: Likely viral however there may be some exacerbation of asthma or some other reactive airway disease lung process. I think patient would benefit from treatment with an inhaled corticosteroid with long-acting beta agonist. We'll prescribe Symbicort.  Additionally patient continues to have nasal discharge likely related to allergic rhinitis. Will treat with continued Claritin Nasonex and Singulair. Additionally we'll use Astelin nasal spray.  Libido and fatigue: Plan to check basic workup listed  below.  Orders Placed This Encounter  Procedures  . CBC  . COMPLETE METABOLIC PANEL WITH GFR  . Lipid Panel w/reflex Direct LDL  . Testosterone  . HIV antibody  . TSH   Meds ordered this encounter  Medications  . budesonide-formoterol (SYMBICORT) 80-4.5 MCG/ACT inhaler    Sig: Inhale 2 puffs into the lungs 2 (two) times daily.    Dispense:  1 Inhaler    Refill:  12  . azelastine (ASTELIN) 0.1 % nasal  spray    Sig: Place 2 sprays into both nostrils 2 (two) times daily. Use in each nostril as directed    Dispense:  30 mL    Refill:  12     Discussed warning signs or symptoms. Please see discharge instructions. Patient expresses understanding.

## 2016-03-15 NOTE — Telephone Encounter (Signed)
Pt will talk to pharmacy and get back to [place].

## 2016-03-18 LAB — CBC
HCT: 47.1 % (ref 38.5–50.0)
Hemoglobin: 16.1 g/dL (ref 13.2–17.1)
MCH: 30.7 pg (ref 27.0–33.0)
MCHC: 34.2 g/dL (ref 32.0–36.0)
MCV: 89.7 fL (ref 80.0–100.0)
MPV: 10.5 fL (ref 7.5–12.5)
Platelets: 223 10*3/uL (ref 140–400)
RBC: 5.25 MIL/uL (ref 4.20–5.80)
RDW: 13.4 % (ref 11.0–15.0)
WBC: 10.1 10*3/uL (ref 3.8–10.8)

## 2016-03-19 LAB — TSH: TSH: 1.9 mIU/L (ref 0.40–4.50)

## 2016-03-19 LAB — LIPID PANEL W/REFLEX DIRECT LDL
Cholesterol: 171 mg/dL (ref <200)
HDL: 33 mg/dL — ABNORMAL LOW (ref >40)
LDL-Cholesterol: 114 mg/dL — ABNORMAL HIGH
Non-HDL Cholesterol (Calc): 138 mg/dL — ABNORMAL HIGH (ref <130)
Total CHOL/HDL Ratio: 5.2 Ratio — ABNORMAL HIGH (ref <5.0)
Triglycerides: 129 mg/dL (ref <150)

## 2016-03-19 LAB — COMPLETE METABOLIC PANEL WITH GFR
ALT: 22 U/L (ref 9–46)
AST: 22 U/L (ref 10–40)
Albumin: 4.3 g/dL (ref 3.6–5.1)
Alkaline Phosphatase: 62 U/L (ref 40–115)
BUN: 14 mg/dL (ref 7–25)
CO2: 30 mmol/L (ref 20–31)
Calcium: 9.4 mg/dL (ref 8.6–10.3)
Chloride: 104 mmol/L (ref 98–110)
Creat: 1.04 mg/dL (ref 0.60–1.35)
GFR, Est African American: >89 mL/min (ref >=60)
GFR, Est Non African American: 89 mL/min (ref >=60)
Glucose, Bld: 81 mg/dL (ref 65–99)
Potassium: 4.6 mmol/L (ref 3.5–5.3)
Sodium: 141 mmol/L (ref 135–146)
Total Bilirubin: 1.1 mg/dL (ref 0.2–1.2)
Total Protein: 7.1 g/dL (ref 6.1–8.1)

## 2016-03-19 LAB — TESTOSTERONE: Testosterone: 423 ng/dL (ref 250–827)

## 2016-03-19 LAB — HIV ANTIBODY (ROUTINE TESTING W REFLEX): HIV 1&2 Ab, 4th Generation: NONREACTIVE

## 2016-04-22 ENCOUNTER — Ambulatory Visit (INDEPENDENT_AMBULATORY_CARE_PROVIDER_SITE_OTHER): Payer: BLUE CROSS/BLUE SHIELD | Admitting: Physician Assistant

## 2016-04-22 VITALS — BP 122/84 | HR 75 | Wt 189.0 lb

## 2016-04-22 DIAGNOSIS — J309 Allergic rhinitis, unspecified: Secondary | ICD-10-CM

## 2016-04-22 DIAGNOSIS — R062 Wheezing: Secondary | ICD-10-CM

## 2016-04-22 DIAGNOSIS — H1013 Acute atopic conjunctivitis, bilateral: Secondary | ICD-10-CM

## 2016-04-22 MED ORDER — OLOPATADINE HCL 0.2 % OP SOLN: 1.0000 [drp] | Freq: Every day | OPHTHALMIC | 5 refills | Status: DC

## 2016-04-22 MED ORDER — LEVOCETIRIZINE DIHYDROCHLORIDE 5 MG PO TABS: 5.0000 mg | ORAL_TABLET | Freq: Every evening | ORAL | 5 refills | Status: DC

## 2016-04-22 NOTE — Progress Notes (Signed)
HPI:                                                                Shane Thomas is a 41 y.o. male who presents to [place]: [place] today for allergies  Patient with history of allergic rhinitis complains of nasal congestion and water eyes x 4 days. Endorses persistent rhinorrhea and itchy eyes.  Denies fever, chills, malaise, myalgias. Denies vision change, headache. Denies wheezing, dyspnea.  Past Medical History:  Diagnosis Date  . History of melanoma excision   . Seasonal allergies    Past Surgical History:  Procedure Laterality Date  . WISDOM TOOTH EXTRACTION     Social History  Substance Use Topics  . Smoking status: Never Smoker  . Smokeless tobacco: Current User  . Alcohol use 1.2 oz/week    2 Cans of beer per week     Comment: occassionally   family history includes Hypertension in his father.  ROS: negative except as noted in the HPI  Medications: Current Outpatient Prescriptions  Medication Sig Dispense Refill  . azelastine (ASTELIN) 0.1 % nasal spray Place 2 sprays into both nostrils 2 (two) times daily. Use in each nostril as directed 30 mL 12  . budesonide-formoterol (SYMBICORT) 80-4.5 MCG/ACT inhaler Inhale 2 puffs into the lungs 2 (two) times daily. 1 Inhaler 12  . loratadine (CLARITIN) 10 MG tablet Take 1 tablet (10 mg total) by mouth daily. 30 tablet 6  . mometasone (NASONEX) 50 MCG/ACT nasal spray Place 2 sprays into the nose 2 (two) times daily. 1 or 2 sprays in each nostril twice daily 17 g 12  . montelukast (SINGULAIR) 10 MG tablet Take 1 tablet (10 mg total) by mouth at bedtime. 30 tablet 6  . triamcinolone cream (KENALOG) 0.1 % Apply 1 application topically 2 (two) times daily. 30 g 0   No current facility-administered medications for this visit.    No Known Allergies     Objective:  BP 122/84   Pulse 75   Wt 189 lb (85.7 kg)   BMI 26.36 kg/m  Gen: well-groomed, cooperative, not ill-appearing, no  distress HEENT: conjunctiva mildly injected bilaterally, TM's clear, nasal mucosa edematous, there is rhinorrhea, oropharynx clear, moist mucus membranes, no frontal or maxillary sinus tenderness Pulm: Normal work of breathing, normal phonation, diffuse wheezes bilaterally CV: Normal rate, regular rhythm, s1 and s2 distinct, no murmurs, clicks or rubs  Neuro: alert and oriented x 3, EOM's intact Lymph: no cervical or tonsillar adenopathy Skin: warm and dry, no rashes or lesions on exposed skin, no cyanosis  CXR 03/28/15 CLINICAL DATA:  Shortness of breath and chest pain  EXAM: CHEST  2 VIEW  COMPARISON:  August 27, 2014  FINDINGS: There is evidence of a hiatal hernia. There is no edema or consolidation. Heart size and pulmonary vascularity are normal. No adenopathy. No pneumothorax. No bone lesions.  IMPRESSION: Evidence of a hiatal hernia.  No edema or consolidation.   Electronically Signed   By: Lowella Grip III M.D.   On: 03/28/2015 18:53  Assessment and Plan: 41 y.o. male with   1. Allergic conjunctivitis and rhinitis, bilateral - d/c Loratadine and switching to Xyzal. Cont Singulair and Nasonex daily - levocetirizine (XYZAL) 5 MG tablet; Take  1 tablet (5 mg total) by mouth every evening.  Dispense: 30 tablet; Refill: 5 - Olopatadine HCl 0.2 % SOLN; Apply 1 drop to eye daily.  Dispense: 5 mL; Refill: 5 - Ambulatory referral to Allergy for possible injections  2. Wheezing - patient declined DuoNeb, stating it did not help in the past. He is asymptomatic. Suspect underlying asthma. He is on Symbicort bid. - reviewed most recent CXR from approx 1 year ago, which was negative for cardiopulmonary disease - recommend follow-up spirometry with PCP when he is feeling better   Patient education and anticipatory guidance given Patient agrees with treatment plan Follow-up as needed if symptoms worsen or fail to improve  Darlyne Russian PA-C

## 2016-04-22 NOTE — Patient Instructions (Signed)
Use eye drops - 1 drop per eye once a day  Stop Loratadine (Claritin) Start Xyzal daily - antihistamine  Continue your nasal spray - aim away from your nasal septum - DO NOT SNIFF HARD - tilt head back and gently inhale  Follow-up with Allergy - I have placed a referral. You will be contacted to schedule an appointment  Allergic Rhinitis Allergic rhinitis is when the mucous membranes in the nose respond to allergens. Allergens are particles in the air that cause your body to have an allergic reaction. This causes you to release allergic antibodies. Through a chain of events, these eventually cause you to release histamine into the blood stream. Although meant to protect the body, it is this release of histamine that causes your discomfort, such as frequent sneezing, congestion, and an itchy, runny nose. What are the causes? Seasonal allergic rhinitis (hay fever) is caused by pollen allergens that may come from grasses, trees, and weeds. Year-round allergic rhinitis (perennial allergic rhinitis) is caused by allergens such as house dust mites, pet dander, and mold spores. What are the signs or symptoms?  Nasal stuffiness (congestion).  Itchy, runny nose with sneezing and tearing of the eyes. How is this diagnosed? Your health care provider can help you determine the allergen or allergens that trigger your symptoms. If you and your health care provider are unable to determine the allergen, skin or blood testing may be used. Your health care provider will diagnose your condition after taking your health history and performing a physical exam. Your health care provider may assess you for other related conditions, such as asthma, pink eye, or an ear infection. How is this treated? Allergic rhinitis does not have a cure, but it can be controlled by:  Medicines that block allergy symptoms. These may include allergy shots, nasal sprays, and oral antihistamines.  Avoiding the allergen. Hay fever  may often be treated with antihistamines in pill or nasal spray forms. Antihistamines block the effects of histamine. There are over-the-counter medicines that may help with nasal congestion and swelling around the eyes. Check with your health care provider before taking or giving this medicine. If avoiding the allergen or the medicine prescribed do not work, there are many new medicines your health care provider can prescribe. Stronger medicine may be used if initial measures are ineffective. Desensitizing injections can be used if medicine and avoidance does not work. Desensitization is when a patient is given ongoing shots until the body becomes less sensitive to the allergen. Make sure you follow up with your health care provider if problems continue. Follow these instructions at home: It is not possible to completely avoid allergens, but you can reduce your symptoms by taking steps to limit your exposure to them. It helps to know exactly what you are allergic to so that you can avoid your specific triggers. Contact a health care provider if:  You have a fever.  You develop a cough that does not stop easily (persistent).  You have shortness of breath.  You start wheezing.  Symptoms interfere with normal daily activities. This information is not intended to replace advice given to you by your health care provider. Make sure you discuss any questions you have with your health care provider. Document Released: 10/09/2000 Document Revised: 09/15/2015 Document Reviewed: 09/21/2012 Elsevier Interactive Patient Education  2017 [employer].

## 2016-05-08 DIAGNOSIS — H1013 Acute atopic conjunctivitis, bilateral: Secondary | ICD-10-CM | POA: Diagnosis not present

## 2016-05-08 DIAGNOSIS — J3081 Allergic rhinitis due to animal (cat) (dog) hair and dander: Secondary | ICD-10-CM | POA: Diagnosis not present

## 2016-05-08 DIAGNOSIS — J3089 Other allergic rhinitis: Secondary | ICD-10-CM | POA: Diagnosis not present

## 2016-05-08 DIAGNOSIS — J4541 Moderate persistent asthma with (acute) exacerbation: Secondary | ICD-10-CM | POA: Diagnosis not present

## 2016-05-08 DIAGNOSIS — J301 Allergic rhinitis due to pollen: Secondary | ICD-10-CM | POA: Diagnosis not present

## 2016-08-18 ENCOUNTER — Other Ambulatory Visit: Payer: Self-pay | Admitting: Family Medicine

## 2016-09-10 ENCOUNTER — Encounter: Payer: Self-pay | Admitting: Family Medicine

## 2016-09-10 ENCOUNTER — Ambulatory Visit (INDEPENDENT_AMBULATORY_CARE_PROVIDER_SITE_OTHER): Payer: BLUE CROSS/BLUE SHIELD | Admitting: Family Medicine

## 2016-09-10 VITALS — BP 117/80 | HR 80 | Ht 71.0 in | Wt 181.0 lb

## 2016-09-10 DIAGNOSIS — R6882 Decreased libido: Secondary | ICD-10-CM | POA: Diagnosis not present

## 2016-09-10 DIAGNOSIS — R5383 Other fatigue: Secondary | ICD-10-CM | POA: Diagnosis not present

## 2016-09-10 DIAGNOSIS — R1310 Dysphagia, unspecified: Secondary | ICD-10-CM

## 2016-09-10 MED ORDER — SILDENAFIL CITRATE 20 MG PO TABS: ORAL_TABLET | ORAL | 11 refills | Status: DC

## 2016-09-10 MED ORDER — SILDENAFIL CITRATE 20 MG PO TABS: 20.0000 mg | ORAL_TABLET | ORAL | 11 refills | Status: DC | PRN

## 2016-09-10 NOTE — Progress Notes (Signed)
Shane Thomas is a 41 y.o. male who presents to [place]: [place] today for follow-up fatigue and decreased libido or erectile dysfunction and dysphagia.  Fatigue: Ongoing for months. Patient notes that he feels tired throughout the day. He thinks he sleeps on average about 7 or 8 hours per night. He notes that he does snore and has been told that he stops breathing at night. He is worried he has a vitamin or mineral deficiency. He has not tried any treatment for this issue yet. No fevers chills vomiting or diarrhea.  Decreased libido: Patient has decreased libido associated with fatigue as above. This is associated with occasional mild erectile dysfunction. He wonders if he has low testosterone. He has not tried treatment for it yet.  Dysphagia: Patient has a feeling as though things gets up when he swallows. He has had occasionally solid objects get somewhat stuck or obstructed. He notes his father had a history of esophageal stricture. He denies any trouble breathing.   Past Medical History:  Diagnosis Date  . History of melanoma excision   . Seasonal allergies    Past Surgical History:  Procedure Laterality Date  . WISDOM TOOTH EXTRACTION     Social History  Substance Use Topics  . Smoking status: Never Smoker  . Smokeless tobacco: Current User  . Alcohol use 1.2 oz/week    2 Cans of beer per week     Comment: occassionally   family history includes Hypertension in his father.  ROS as above:  Medications: Current Outpatient Prescriptions  Medication Sig Dispense Refill  . azelastine (ASTELIN) 0.1 % nasal spray Place 2 sprays into both nostrils 2 (two) times daily. Use in each nostril as directed 30 mL 12  . budesonide-formoterol (SYMBICORT) 80-4.5 MCG/ACT inhaler Inhale 2 puffs into the lungs 2 (two) times daily. 1 Inhaler 12  . levocetirizine (XYZAL) 5 MG  tablet Take 1 tablet (5 mg total) by mouth every evening. 30 tablet 5  . mometasone (NASONEX) 50 MCG/ACT nasal spray Place 2 sprays into the nose 2 (two) times daily. 1 or 2 sprays in each nostril twice daily 17 g 12  . montelukast (SINGULAIR) 10 MG tablet TAKE 1 TABLET BY MOUTH AT BEDTIME 30 tablet 1  . Olopatadine HCl 0.2 % SOLN Apply 1 drop to eye daily. 5 mL 5  . sildenafil (REVATIO) 20 MG tablet Take 2-5 pills po prior to sex daily as needed. 50 tablet 11   No current facility-administered medications for this visit.    No Known Allergies  Health Maintenance Health Maintenance  Topic Date Due  . TETANUS/TDAP  06/27/1994  . INFLUENZA VACCINE  08/28/2016  . HIV Screening  Completed     Exam:  BP 117/80   Pulse 80   Ht 5\' 11"  (1.803 m)   Wt 181 lb (82.1 kg)   BMI 25.24 kg/m  Gen: Well NAD HEENT: EOMI,  MMM Normal posterior pharynx Lungs: Normal work of breathing. CTABL Heart: RRR no MRG Abd: NABS, Soft. Nondistended, Nontender Exts: Brisk capillary refill, warm and well perfused.  STOP BANG: Snore:     Yes Tired:     Yes Observed stop breathing:  Yes Hypertension:   No  BMI >35:   No Age >50:   No Neck > 16 inches:  Yes Male gender:   Yes ------------------------------------------ Total:     5/8    No results found for this or any previous  visit (from the past 72 hour(s)). No results found.    Assessment and Plan: 41 y.o. male with  Fatigue: Unclear etiology. Concerning for sleep apnea or other metabolic disorder. Plan to check labs listed below as well as obtain a sleep study. Follow-up after results.  Low libido: Unclear possibly related to fatigue cause. Additionally we'll check testosterone  Erectile dysfunction: Unclear etiology as well. Plan to use sildenafil while working up low libido.  Dysphagia: Possibly globus sensation possible stricture. Plan refer to gastroenterology for likely EGD.   Orders Placed This Encounter  Procedures  .  Testosterone Total,Free,Bio, Males  . TSH  . T4, free  . T3, free  . Vitamin B12  . Ambulatory referral to Gastroenterology    Referral Priority:   Routine    Referral Type:   Consultation    Referral Reason:   Specialty Services Required    Number of Visits Requested:   1  . Home sleep test    Standing Status:   Future    Standing Expiration Date:   09/10/2017    Scheduling Instructions:     [facility]    Order Specific Question:   Where should this test be performed:    Answer:   Other   Meds ordered this encounter  Medications  . DISCONTD: sildenafil (REVATIO) 20 MG tablet    Sig: Take 1 tablet (20 mg total) by mouth as needed.    Dispense:  50 tablet    Refill:  11  . sildenafil (REVATIO) 20 MG tablet    Sig: Take 2-5 pills po prior to sex daily as needed.    Dispense:  50 tablet    Refill:  11     Discussed warning signs or symptoms. Please see discharge instructions. Patient expresses understanding.  I spent 40 minutes with this patient, greater than 50% was face-to-face time counseling regarding uncertain prognosis of the above diagnoses as well as potential side effects of medication.

## 2016-09-10 NOTE — Patient Instructions (Addendum)
Thank you for coming in today.  You should hear about Gastroenterology and Sleep study in the next few days.  Let me know if you do not hear anything in 5 business days.   Get fasting labs in the near future.  This should be checked before 10 am .  The lab is open at 730.   We will get results ASAP>    You should hear from [facility] soon.    Sleep Apnea Sleep apnea is a condition in which breathing pauses or becomes shallow during sleep. Episodes of sleep apnea usually last 10 seconds or longer, and they may occur as many as 20 times an hour. Sleep apnea disrupts your sleep and keeps your body from getting the rest that it needs. This condition can increase your risk of certain health problems, including:  Heart attack.  Stroke.  Obesity.  Diabetes.  Heart failure.  Irregular heartbeat.  There are three kinds of sleep apnea:  Obstructive sleep apnea. This kind is caused by a blocked or collapsed airway.  Central sleep apnea. This kind happens when the part of the brain that controls breathing does not send the correct signals to the muscles that control breathing.  Mixed sleep apnea. This is a combination of obstructive and central sleep apnea.  What are the causes? The most common cause of this condition is a collapsed or blocked airway. An airway can collapse or become blocked if:  Your throat muscles are abnormally relaxed.  Your tongue and tonsils are larger than normal.  You are overweight.  Your airway is smaller than normal.  What increases the risk? This condition is more likely to develop in people who:  Are overweight.  Smoke.  Have a smaller than normal airway.  Are elderly.  Are male.  Drink alcohol.  Take sedatives or tranquilizers.  Have a family history of sleep apnea.  What are the signs or symptoms? Symptoms of this condition include:  Trouble staying asleep.  Daytime sleepiness and tiredness.  Irritability.  Loud  snoring.  Morning headaches.  Trouble concentrating.  Forgetfulness.  Decreased interest in sex.  Unexplained sleepiness.  Mood swings.  Personality changes.  Feelings of depression.  Waking up often during the night to urinate.  Dry mouth.  Sore throat.  How is this diagnosed? This condition may be diagnosed with:  A medical history.  A physical exam.  A series of tests that are done while you are sleeping (sleep study). These tests are usually done in a sleep lab, but they may also be done at home.  How is this treated? Treatment for this condition aims to restore normal breathing and to ease symptoms during sleep. It may involve managing health issues that can affect breathing, such as high blood pressure or obesity. Treatment may include:  Sleeping on your side.  Using a decongestant if you have nasal congestion.  Avoiding the use of depressants, including alcohol, sedatives, and narcotics.  Losing weight if you are overweight.  Making changes to your diet.  Quitting smoking.  Using a device to open your airway while you sleep, such as: ? An oral appliance. This is a custom-made mouthpiece that shifts your lower jaw forward. ? A continuous positive airway pressure (CPAP) device. This device delivers oxygen to your airway through a mask. ? A nasal expiratory positive airway pressure (EPAP) device. This device has valves that you put into each nostril. ? A bi-level positive airway pressure (BPAP) device. This device delivers oxygen  to your airway through a mask.  Surgery if other treatments do not work. During surgery, excess tissue is removed to create a wider airway.  It is important to get treatment for sleep apnea. Without treatment, this condition can lead to:  High blood pressure.  Coronary artery disease.  (Men) An inability to achieve or maintain an erection (impotence).  Reduced thinking abilities.  Follow these instructions at  home:  Make any lifestyle changes that your health care provider recommends.  Eat a healthy, well-balanced diet.  Take over-the-counter and prescription medicines only as told by your health care provider.  Avoid using depressants, including alcohol, sedatives, and narcotics.  Take steps to lose weight if you are overweight.  If you were given a device to open your airway while you sleep, use it only as told by your health care provider.  Do not use any tobacco products, such as cigarettes, chewing tobacco, and e-cigarettes. If you need help quitting, ask your health care provider.  Keep all follow-up visits as told by your health care provider. This is important. Contact a health care provider if:  The device that you received to open your airway during sleep is uncomfortable or does not seem to be working.  Your symptoms do not improve.  Your symptoms get worse. Get help right away if:  You develop chest pain.  You develop shortness of breath.  You develop discomfort in your back, arms, or stomach.  You have trouble speaking.  You have weakness on one side of your body.  You have drooping in your face. These symptoms may represent a serious problem that is an emergency. Do not wait to see if the symptoms will go away. Get medical help right away. Call your local emergency services (911 in the [place] Do not drive yourself to the hospital. This information is not intended to replace advice given to you by your health care provider. Make sure you discuss any questions you have with your health care provider. Document Released: 01/04/2002 Document Revised: 09/10/2015 Document Reviewed: 10/24/2014 Elsevier Interactive Patient Education  [employer].

## 2016-09-12 DIAGNOSIS — K200 Eosinophilic esophagitis: Secondary | ICD-10-CM | POA: Diagnosis not present

## 2016-09-12 DIAGNOSIS — R1310 Dysphagia, unspecified: Secondary | ICD-10-CM | POA: Diagnosis not present

## 2016-09-16 ENCOUNTER — Encounter: Payer: Self-pay | Admitting: Family Medicine

## 2016-09-16 DIAGNOSIS — K200 Eosinophilic esophagitis: Secondary | ICD-10-CM

## 2016-09-16 HISTORY — DX: Eosinophilic esophagitis: K20.0

## 2016-09-23 DIAGNOSIS — R5383 Other fatigue: Secondary | ICD-10-CM | POA: Diagnosis not present

## 2016-09-23 DIAGNOSIS — G4730 Sleep apnea, unspecified: Secondary | ICD-10-CM | POA: Diagnosis not present

## 2016-09-23 DIAGNOSIS — R0683 Snoring: Secondary | ICD-10-CM | POA: Diagnosis not present

## 2016-10-03 ENCOUNTER — Telehealth: Payer: Self-pay | Admitting: Family Medicine

## 2016-10-03 DIAGNOSIS — G4733 Obstructive sleep apnea (adult) (pediatric): Secondary | ICD-10-CM

## 2016-10-03 MED ORDER — AMBULATORY NON FORMULARY MEDICATION: 0 refills | Status: DC

## 2016-10-03 NOTE — Telephone Encounter (Signed)
Sleep study shows mild sleep apnea.  We recommend a trial of CPAP because you are fatigued.  We will get the ball rolling on this today.

## 2016-10-07 NOTE — Telephone Encounter (Signed)
Form awaiting physician signature.

## 2016-10-09 NOTE — Telephone Encounter (Signed)
Information discussed with pt. Pt verbalized understanding. 

## 2016-10-09 NOTE — Telephone Encounter (Signed)
CPAP order, sleep study, demographics, insurance, and OV notes faxed to [facility] at [phone].

## 2016-10-10 DIAGNOSIS — R1319 Other dysphagia: Secondary | ICD-10-CM | POA: Diagnosis not present

## 2016-10-10 DIAGNOSIS — K209 Esophagitis, unspecified: Secondary | ICD-10-CM | POA: Diagnosis not present

## 2016-10-10 DIAGNOSIS — K449 Diaphragmatic hernia without obstruction or gangrene: Secondary | ICD-10-CM | POA: Diagnosis not present

## 2016-10-10 DIAGNOSIS — K222 Esophageal obstruction: Secondary | ICD-10-CM | POA: Diagnosis not present

## 2016-10-10 DIAGNOSIS — K219 Gastro-esophageal reflux disease without esophagitis: Secondary | ICD-10-CM | POA: Diagnosis not present

## 2016-11-13 ENCOUNTER — Other Ambulatory Visit: Payer: Self-pay | Admitting: Family Medicine

## 2017-01-14 ENCOUNTER — Ambulatory Visit (INDEPENDENT_AMBULATORY_CARE_PROVIDER_SITE_OTHER): Payer: BLUE CROSS/BLUE SHIELD | Admitting: Family Medicine

## 2017-01-14 ENCOUNTER — Encounter: Payer: Self-pay | Admitting: Family Medicine

## 2017-01-14 VITALS — BP 116/76 | HR 82 | Wt 184.0 lb

## 2017-01-14 DIAGNOSIS — J3089 Other allergic rhinitis: Secondary | ICD-10-CM | POA: Diagnosis not present

## 2017-01-14 DIAGNOSIS — Z23 Encounter for immunization: Secondary | ICD-10-CM

## 2017-01-14 NOTE — Progress Notes (Signed)
Shane Thomas is a 41 y.o. male who presents to [facility] today for follow-up nasal congestion.  Shane Thomas notes persistent right nasal congestion.  This is been ongoing for months.  He has had a trial of several different nasal steroids, Astelin nasal spray, Xyzal, and Singulair.  He notes he feels well overall but notes especially at nighttime bothersome nasal congestion and some difficulty breathing.  He denies any wheezing or cough.  He has been seen by an allergist who did not have much to offer besides allergy desensitization shot series which was very expensive.    Past Medical History:  Diagnosis Date  . Eosinophilic esophagitis 8/67/6195   Status post pill impaction with esophageal rings. Currently [facility]. Note available in Care Everywhere.   Marland Kitchen History of melanoma excision   . Seasonal allergies    Past Surgical History:  Procedure Laterality Date  . ESOPHAGOGASTRODUODENOSCOPY  0932   Eosinophilic esophagitis  . WISDOM TOOTH EXTRACTION     Social History   Tobacco Use  . Smoking status: Never Smoker  . Smokeless tobacco: Current User  Substance Use Topics  . Alcohol use: Yes    Alcohol/week: 1.2 oz    Types: 2 Cans of beer per week    Comment: occassionally   family history includes Hypertension in his father.  ROS as above:  Medications: Current Outpatient Medications  Medication Sig Dispense Refill  . AMBULATORY NON FORMULARY MEDICATION Continuous positive airway pressure (CPAP) machine auto-titrate from 4-20 cm of H2O pressure, with all supplemental supplies as needed. 1 each 0  . azelastine (ASTELIN) 0.1 % nasal spray Place 2 sprays into both nostrils 2 (two) times daily. Use in each nostril as directed 30 mL 12  . budesonide-formoterol (SYMBICORT) 80-4.5 MCG/ACT inhaler Inhale 2 puffs into the lungs 2 (two)  times daily. 1 Inhaler 12  . levocetirizine (XYZAL) 5 MG tablet Take 1 tablet (5 mg total) by mouth every evening. 30 tablet 5  . mometasone (NASONEX) 50 MCG/ACT nasal spray Place 2 sprays into the nose 2 (two) times daily. 1 or 2 sprays in each nostril twice daily 17 g 12  . montelukast (SINGULAIR) 10 MG tablet TAKE 1 TABLET BY MOUTH AT BEDTIME 30 tablet 1  . Olopatadine HCl 0.2 % SOLN Apply 1 drop to eye daily. 5 mL 5  . sildenafil (REVATIO) 20 MG tablet Take 2-5 pills po prior to sex daily as needed. 50 tablet 11   No current facility-administered medications for this visit.    No Known Allergies  Health Maintenance Health Maintenance  Topic Date Due  . TETANUS/TDAP  06/27/1994  . INFLUENZA VACCINE  09/16/2017 (Originally 08/28/2016)  . HIV Screening  Completed     Exam:  BP 116/76   Pulse 82   Wt 184 lb (83.5 kg)   BMI 25.66 kg/m  Gen: Well NAD HEENT: EOMI,  MMM nasal congestion bilaterally with inflamed nasal turbinates.  Nontender maxillary sinus Lungs: Normal work of breathing. CTABL Heart: RRR no MRG Abd: NABS, Soft. Nondistended, Nontender Exts: Brisk capillary refill, warm and well perfused.    No results found for this or any previous visit (from the past 72 hour(s)). No results found.    Assessment and Plan: 41 y.o. male with persistent unilateral nasal congestion.  At this point he has failed maximal medical intervention.  I think it is reasonable to proceed with referral to ENT  for evaluation and possible surgical intervention.  Recheck as needed.  Tdap vaccine given prior to discharge.   Orders Placed This Encounter  Procedures  . Tdap vaccine greater than or equal to 7yo IM  . Ambulatory referral to ENT    Referral Priority:   Routine    Referral Type:   Consultation    Referral Reason:   Specialty Services Required    Requested Specialty:   Otolaryngology    Number of Visits Requested:   1   No orders of the defined types were placed in this  encounter.    Discussed warning signs or symptoms. Please see discharge instructions. Patient expresses understanding.

## 2017-01-14 NOTE — Patient Instructions (Addendum)
Thank you for coming in today. You should hear from ENT soon.  Let me know if you do not hear anything in about 1 week.  Recheck as needed.   Tdap Vaccine (Tetanus, Diphtheria and Pertussis): What You Need to Know 1. Why get vaccinated? Tetanus, diphtheria and pertussis are very serious diseases. Tdap vaccine can protect [place] from these diseases. And, Tdap vaccine given to pregnant women can protect newborn babies against pertussis. TETANUS (Lockjaw) is rare in the [place] today. It causes painful muscle tightening and stiffness, usually all over the body.  It can lead to tightening of muscles in the head and neck so you can't open your mouth, swallow, or sometimes even breathe. Tetanus kills about 1 out of 10 people who are infected even after receiving the best medical care.  DIPHTHERIA is also rare in the [place] today. It can cause a thick coating to form in the back of the throat.  It can lead to breathing problems, heart failure, paralysis, and death.  PERTUSSIS (Whooping Cough) causes severe coughing spells, which can cause difficulty breathing, vomiting and disturbed sleep.  It can also lead to weight loss, incontinence, and rib fractures. Up to 2 in 100 adolescents and 5 in 100 adults with pertussis are hospitalized or have complications, which could include pneumonia or death.  These diseases are caused by bacteria. Diphtheria and pertussis are spread from person to person through secretions from coughing or sneezing. Tetanus enters the body through cuts, scratches, or wounds. Before vaccines, as many as 200,000 cases of diphtheria, 200,000 cases of pertussis, and hundreds of cases of tetanus, were reported in the [place] each year. Since vaccination began, reports of cases for tetanus and diphtheria have dropped by about 99% and for pertussis by about 80%. 2. Tdap vaccine Tdap vaccine can protect adolescents and adults from tetanus, diphtheria, and pertussis. One  dose of Tdap is routinely given at age 98 or 37. People who did not get Tdap at that age should get it as soon as possible. Tdap is especially important for healthcare professionals and anyone having close contact with a baby younger than 12 months. Pregnant women should get a dose of Tdap during every pregnancy, to protect the newborn from pertussis. Infants are most at risk for severe, life-threatening complications from pertussis. Another vaccine, called Td, protects against tetanus and diphtheria, but not pertussis. A Td booster should be given every 10 years. Tdap may be given as one of these boosters if you have never gotten Tdap before. Tdap may also be given after a severe cut or burn to prevent tetanus infection. Your doctor or the person giving you the vaccine can give you more information. Tdap may safely be given at the same time as other vaccines. 3. Some people should not get this vaccine  A person who has ever had a life-threatening allergic reaction after a previous dose of any diphtheria, tetanus or pertussis containing vaccine, OR has a severe allergy to any part of this vaccine, should not get Tdap vaccine. Tell the person giving the vaccine about any severe allergies.  Anyone who had coma or long repeated seizures within 7 days after a childhood dose of DTP or DTaP, or a previous dose of Tdap, should not get Tdap, unless a cause other than the vaccine was found. They can still get Td.  Talk to your doctor if you: ? have seizures or another nervous system problem, ? had severe pain or swelling after any  vaccine containing diphtheria, tetanus or pertussis, ? ever had a condition called Guillain-Barr Syndrome (GBS), ? aren't feeling well on the day the shot is scheduled. 4. Risks With any medicine, including vaccines, there is a chance of side effects. These are usually mild and go away on their own. Serious reactions are also possible but are rare. Most people who get Tdap  vaccine do not have any problems with it. Mild problems following Tdap: (Did not interfere with activities)  Pain where the shot was given (about 3 in 4 adolescents or 2 in 3 adults)  Redness or swelling where the shot was given (about 1 person in 5)  Mild fever of at least 100.22F (up to about 1 in 25 adolescents or 1 in 100 adults)  Headache (about 3 or 4 people in 10)  Tiredness (about 1 person in 3 or 4)  Nausea, vomiting, diarrhea, stomach ache (up to 1 in 4 adolescents or 1 in 10 adults)  Chills, sore joints (about 1 person in 10)  Body aches (about 1 person in 3 or 4)  Rash, swollen glands (uncommon)  Moderate problems following Tdap: (Interfered with activities, but did not require medical attention)  Pain where the shot was given (up to 1 in 5 or 6)  Redness or swelling where the shot was given (up to about 1 in 16 adolescents or 1 in 12 adults)  Fever over 102F (about 1 in 100 adolescents or 1 in 250 adults)  Headache (about 1 in 7 adolescents or 1 in 10 adults)  Nausea, vomiting, diarrhea, stomach ache (up to 1 or 3 people in 100)  Swelling of the entire arm where the shot was given (up to about 1 in 500).  Severe problems following Tdap: (Unable to perform usual activities; required medical attention)  Swelling, severe pain, bleeding and redness in the arm where the shot was given (rare).  Problems that could happen after any vaccine:  People sometimes faint after a medical procedure, including vaccination. Sitting or lying down for about 15 minutes can help prevent fainting, and injuries caused by a fall. Tell your doctor if you feel dizzy, or have vision changes or ringing in the ears.  Some people get severe pain in the shoulder and have difficulty moving the arm where a shot was given. This happens very rarely.  Any medication can cause a severe allergic reaction. Such reactions from a vaccine are very rare, estimated at fewer than 1 in a million  doses, and would happen within a few minutes to a few hours after the vaccination. As with any medicine, there is a very remote chance of a vaccine causing a serious injury or death. The safety of vaccines is always being monitored. For more information, visit: http://www.aguilar.org/ 5. What if there is a serious problem? What should I look for? Look for anything that concerns you, such as signs of a severe allergic reaction, very high fever, or unusual behavior. Signs of a severe allergic reaction can include hives, swelling of the face and throat, difficulty breathing, a fast heartbeat, dizziness, and weakness. These would usually start a few minutes to a few hours after the vaccination. What should I do?  If you think it is a severe allergic reaction or other emergency that can't wait, call 9-1-1 or get the person to the nearest hospital. Otherwise, call your doctor.  Afterward, the reaction should be reported to the Vaccine Adverse Event Reporting System (VAERS). Your doctor might file this report, or  you can do it yourself through the VAERS web site at www.vaers.SamedayNews.es, or by calling (216)690-5640. ? VAERS does not give medical advice. 6. The National Vaccine Injury Compensation Program The [employer] Vaccine Injury Compensation Program (VICP) is a federal program that was created to compensate people who may have been injured by certain vaccines. Persons who believe they may have been injured by a vaccine can learn about the program and about filing a claim by calling 7545808460 or visiting the [place] website at GoldCloset.com.ee. There is a time limit to file a claim for compensation. 7. How can I learn more?  Ask your doctor. He or she can give you the vaccine package insert or suggest other sources of information.  Call your local or state health department.  Contact the Centers for Disease Control and Prevention (CDC): ? Call 213-484-1040 (1-800-CDC-INFO)  or ? Visit CDC's website at http://hunter.com/ CDC Tdap Vaccine VIS (03/23/13) This information is not intended to replace advice given to you by your health care provider. Make sure you discuss any questions you have with your health care provider. Document Released: 07/16/2011 Document Revised: 10/05/2015 Document Reviewed: 10/05/2015 Elsevier Interactive Patient Education  2017 [employer].

## 2017-01-27 ENCOUNTER — Other Ambulatory Visit: Payer: Self-pay | Admitting: Physician Assistant

## 2017-01-27 ENCOUNTER — Other Ambulatory Visit: Payer: Self-pay | Admitting: Family Medicine

## 2017-01-27 DIAGNOSIS — J309 Allergic rhinitis, unspecified: Principal | ICD-10-CM

## 2017-01-27 DIAGNOSIS — H1013 Acute atopic conjunctivitis, bilateral: Secondary | ICD-10-CM

## 2017-01-30 DIAGNOSIS — H608X9 Other otitis externa, unspecified ear: Secondary | ICD-10-CM | POA: Diagnosis not present

## 2017-01-30 DIAGNOSIS — J343 Hypertrophy of nasal turbinates: Secondary | ICD-10-CM | POA: Diagnosis not present

## 2017-01-30 DIAGNOSIS — J3489 Other specified disorders of nose and nasal sinuses: Secondary | ICD-10-CM | POA: Diagnosis not present

## 2017-02-18 ENCOUNTER — Telehealth: Payer: Self-pay

## 2017-02-18 NOTE — Telephone Encounter (Signed)
Patient called in requesting appt for his allergies. Patient stated he will be going to [place] on Thursday and wanted a steroid injection. Advised patient to be seen at our Urgent Care for his convenience due to no available openings for his schedule. Advised the steroid injection is up to the provider once he has been assessed. Patient stated he understood.

## 2017-03-30 ENCOUNTER — Other Ambulatory Visit: Payer: Self-pay | Admitting: Family Medicine

## 2017-06-06 ENCOUNTER — Other Ambulatory Visit: Payer: Self-pay | Admitting: Family Medicine

## 2017-06-09 ENCOUNTER — Encounter: Payer: Self-pay | Admitting: Family Medicine

## 2017-06-09 ENCOUNTER — Ambulatory Visit (INDEPENDENT_AMBULATORY_CARE_PROVIDER_SITE_OTHER): Payer: Managed Care, Other (non HMO) | Admitting: Family Medicine

## 2017-06-09 VITALS — BP 108/73 | HR 79 | Temp 97.6°F | Wt 190.0 lb

## 2017-06-09 DIAGNOSIS — R062 Wheezing: Secondary | ICD-10-CM | POA: Diagnosis not present

## 2017-06-09 DIAGNOSIS — K200 Eosinophilic esophagitis: Secondary | ICD-10-CM

## 2017-06-09 DIAGNOSIS — J3089 Other allergic rhinitis: Secondary | ICD-10-CM | POA: Diagnosis not present

## 2017-06-09 MED ORDER — BUDESONIDE-FORMOTEROL FUMARATE 160-4.5 MCG/ACT IN AERO: 1.0000 | INHALATION_SPRAY | Freq: Two times a day (BID) | RESPIRATORY_TRACT | 5 refills | Status: DC

## 2017-06-09 MED ORDER — AZITHROMYCIN 250 MG PO TABS: 250.0000 mg | ORAL_TABLET | Freq: Every day | ORAL | 0 refills | Status: DC

## 2017-06-09 MED ORDER — PREDNISONE 10 MG PO TABS: 30.0000 mg | ORAL_TABLET | Freq: Every day | ORAL | 0 refills | Status: DC

## 2017-06-09 NOTE — Progress Notes (Signed)
Shane Thomas is a 42 y.o. male who presents to [facility] today for allergic rhinitis and asthma.   Shane Thomas notes nasal congestion and clear nasal discharge. Shane Thomas has a history of seasonal allergies and asthma.  He previously was moderately controlled with levocertizine, singulair, nasal steroids and astelin nasal spray as well as symbicort. He was doing pretty well until recently when he worsened. He notes that he ran out of symbicort inhaler. He notes nasal congestion runny nose and itchy watery eyes. He also notes wheezing and cough. He in the past was referred to an allergist who confirmed multiple seasonal and indoor allergies. Additionally he was referred to ENT but was unable to go due to cost issues (ENTwanted a $1200 payment ahead of time).    Past Medical History:  Diagnosis Date  . Eosinophilic esophagitis 1/94/1740   Status post pill impaction with esophageal rings. Currently managed by [facility]. Note available in Care Everywhere.   Marland Kitchen History of melanoma excision   . Seasonal allergies    Past Surgical History:  Procedure Laterality Date  . ESOPHAGOGASTRODUODENOSCOPY  8144   Eosinophilic esophagitis  . WISDOM TOOTH EXTRACTION     Social History   Tobacco Use  . Smoking status: Never Smoker  . Smokeless tobacco: Current User  Substance Use Topics  . Alcohol use: Yes    Alcohol/week: 1.2 oz    Types: 2 Cans of beer per week    Comment: occassionally   family history includes Hypertension in his father.  ROS as above:  Medications: Current Outpatient Medications  Medication Sig Dispense Refill  . AMBULATORY NON FORMULARY MEDICATION Continuous positive airway pressure (CPAP) machine auto-titrate from 4-20 cm of H2O pressure, with all supplemental supplies as needed. 1 each 0  . azelastine (ASTELIN) 0.1 % nasal spray Place 2  sprays into both nostrils 2 (two) times daily. Use in each nostril as directed 30 mL 12  . Beclomethasone Dipropionate (QNASL) 80 MCG/ACT AERS Place 1 puff into both nostrils daily.    . budesonide-formoterol (SYMBICORT) 80-4.5 MCG/ACT inhaler Inhale 2 puffs into the lungs 2 (two) times daily. 1 Inhaler 12  . levocetirizine (XYZAL) 5 MG tablet TAKE 1 TABLET (5 MG TOTAL) BY MOUTH EVERY EVENING. 30 tablet 5  . mometasone (NASONEX) 50 MCG/ACT nasal spray Place 2 sprays into the nose 2 (two) times daily. 1 or 2 sprays in each nostril twice daily 17 g 12  . montelukast (SINGULAIR) 10 MG tablet TAKE 1 TABLET BY MOUTH AT BEDTIME 30 tablet 1  . sildenafil (REVATIO) 20 MG tablet Take 2-5 pills po prior to sex daily as needed. 50 tablet 11  . azithromycin (ZITHROMAX) 250 MG tablet Take 1 tablet (250 mg total) by mouth daily. Take first 2 tablets together, then 1 every day until finished. 6 tablet 0  . budesonide-formoterol (SYMBICORT) 160-4.5 MCG/ACT inhaler Inhale 1 puff into the lungs 2 (two) times daily. 1 Inhaler 5  . omeprazole (PRILOSEC) 40 MG capsule     . predniSONE (DELTASONE) 10 MG tablet Take 3 tablets (30 mg total) by mouth daily with breakfast. 15 tablet 0   No current facility-administered medications for this visit.    No Known Allergies  Health Maintenance Health Maintenance  Topic Date Due  . INFLUENZA VACCINE  09/16/2017 (Originally 08/28/2017)  . TETANUS/TDAP  01/15/2027  . HIV Screening  Completed     Exam:  BP 108/73  Pulse 79   Temp 97.6 F (36.4 C) (Oral)   Wt 190 lb (86.2 kg)   SpO2 96%   BMI 26.50 kg/m  Gen: Well NAD HEENT: EOMI,  MMM inflamed nasal turbinated and clear discharge BL. Normal TM BL.  No cervical LAD.  Posterior pharynx with cobblestoning.  Lungs: Normal work of breathing. Marland Kitchen Hoarse voice. Slight wheeze BL.  Heart: RRR no MRG Abd: NABS, Soft. Nondistended, Nontender Exts: Brisk capillary refill, warm and well perfused.    No results found for  this or any previous visit (from the past 72 hour(s)). No results found.    Assessment and Plan: 41 y.o. male with  Allergic Rhinitis: Continue Levocertizine, Singulair, Qnasal, Astelin. Use short burst of prednisone.  Get records from allergy testing.  Backup azithromycin if not better.  Consider ENT if needed.   Asthma exacerbation: Restart symbicort at 160/4.5. Use short course of prednisone. Albuterol PRN.   Recheck PRN.    No orders of the defined types were placed in this encounter.  Meds ordered this encounter  Medications  . budesonide-formoterol (SYMBICORT) 160-4.5 MCG/ACT inhaler    Sig: Inhale 1 puff into the lungs 2 (two) times daily.    Dispense:  1 Inhaler    Refill:  5  . predniSONE (DELTASONE) 10 MG tablet    Sig: Take 3 tablets (30 mg total) by mouth daily with breakfast.    Dispense:  15 tablet    Refill:  0  . azithromycin (ZITHROMAX) 250 MG tablet    Sig: Take 1 tablet (250 mg total) by mouth daily. Take first 2 tablets together, then 1 every day until finished.    Dispense:  6 tablet    Refill:  0     Discussed warning signs or symptoms. Please see discharge instructions. Patient expresses understanding.

## 2017-06-09 NOTE — Patient Instructions (Signed)
Thank you for coming in today. Restart inhaler.  1 puff twice daily.  Take prednisone for 5 days.  If not better take the azithromycin antibiotic.  Recheck as needed.   Use over-the-counter Zaditor eyedrops (Ketotifen) twice daily as needed for itching.    Allergic Rhinitis, Adult Allergic rhinitis is an allergic reaction that affects the mucous membrane inside the nose. It causes sneezing, a runny or stuffy nose, and the feeling of mucus going down the back of the throat (postnasal drip). Allergic rhinitis can be mild to severe. There are two types of allergic rhinitis:  Seasonal. This type is also called hay fever. It happens only during certain seasons.  Perennial. This type can happen at any time of the year.  What are the causes? This condition happens when the body's defense system (immune system) responds to certain harmless substances called allergens as though they were germs.  Seasonal allergic rhinitis is triggered by pollen, which can come from grasses, trees, and weeds. Perennial allergic rhinitis may be caused by:  House dust mites.  Pet dander.  Mold spores.  What are the signs or symptoms? Symptoms of this condition include:  Sneezing.  Runny or stuffy nose (nasal congestion).  Postnasal drip.  Itchy nose.  Tearing of the eyes.  Trouble sleeping.  Daytime sleepiness.  How is this diagnosed? This condition may be diagnosed based on:  Your medical history.  A physical exam.  Tests to check for related conditions, such as: ? Asthma. ? Pink eye. ? Ear infection. ? Upper respiratory infection.  Tests to find out which allergens trigger your symptoms. These may include skin or blood tests.  How is this treated? There is no cure for this condition, but treatment can help control symptoms. Treatment may include:  Taking medicines that block allergy symptoms, such as antihistamines. Medicine may be given as a shot, nasal spray, or  pill.  Avoiding the allergen.  Desensitization. This treatment involves getting ongoing shots until your body becomes less sensitive to the allergen. This treatment may be done if other treatments do not help.  If taking medicine and avoiding the allergen does not work, new, stronger medicines may be prescribed.  Follow these instructions at home:  Find out what you are allergic to. Common allergens include smoke, dust, and pollen.  Avoid the things you are allergic to. These are some things you can do to help avoid allergens: ? Replace carpet with wood, tile, or vinyl flooring. Carpet can trap dander and dust. ? Do not smoke. Do not allow smoking in your home. ? Change your heating and air conditioning filter at least once a month. ? During allergy season:  Keep windows closed as much as possible.  Plan outdoor activities when pollen counts are lowest. This is usually during the evening hours.  When coming indoors, change clothing and shower before sitting on furniture or bedding.  Take over-the-counter and prescription medicines only as told by your health care provider.  Keep all follow-up visits as told by your health care provider. This is important. Contact a health care provider if:  You have a fever.  You develop a persistent cough.  You make whistling sounds when you breathe (you wheeze).  Your symptoms interfere with your normal daily activities. Get help right away if:  You have shortness of breath. Summary  This condition can be managed by taking medicines as directed and avoiding allergens.  Contact your health care provider if you develop a persistent cough or  fever.  During allergy season, keep windows closed as much as possible. This information is not intended to replace advice given to you by your health care provider. Make sure you discuss any questions you have with your health care provider. Document Released: 10/09/2000 Document Revised: 02/22/2016  Document Reviewed: 02/22/2016 Elsevier Interactive Patient Education  [employer].

## 2017-06-19 ENCOUNTER — Encounter: Payer: Self-pay | Admitting: Family Medicine

## 2017-06-19 NOTE — Progress Notes (Signed)
Office notes from [facility] the [place] received. Will be scanned.  Summary of skin allergy testing  Positive  Johnson grass [place] grass  Mite D Ferinae Mite D Pteronyssinus Cat hair Dog, AP Aspergillosis

## 2017-08-01 ENCOUNTER — Other Ambulatory Visit: Payer: Self-pay | Admitting: Physician Assistant

## 2017-08-01 ENCOUNTER — Other Ambulatory Visit: Payer: Self-pay | Admitting: Family Medicine

## 2017-08-01 DIAGNOSIS — J309 Allergic rhinitis, unspecified: Principal | ICD-10-CM

## 2017-08-01 DIAGNOSIS — H1013 Acute atopic conjunctivitis, bilateral: Secondary | ICD-10-CM

## 2017-10-22 ENCOUNTER — Other Ambulatory Visit: Payer: Self-pay | Admitting: Family Medicine

## 2017-12-02 ENCOUNTER — Encounter: Payer: Self-pay | Admitting: Family Medicine

## 2017-12-02 ENCOUNTER — Ambulatory Visit (INDEPENDENT_AMBULATORY_CARE_PROVIDER_SITE_OTHER): Payer: Managed Care, Other (non HMO) | Admitting: Family Medicine

## 2017-12-02 VITALS — BP 109/73 | HR 72 | Temp 97.7°F | Ht 71.0 in | Wt 187.0 lb

## 2017-12-02 DIAGNOSIS — M7041 Prepatellar bursitis, right knee: Secondary | ICD-10-CM

## 2017-12-02 DIAGNOSIS — K200 Eosinophilic esophagitis: Secondary | ICD-10-CM | POA: Diagnosis not present

## 2017-12-02 DIAGNOSIS — Z6826 Body mass index (BMI) 26.0-26.9, adult: Secondary | ICD-10-CM | POA: Diagnosis not present

## 2017-12-02 DIAGNOSIS — G4733 Obstructive sleep apnea (adult) (pediatric): Secondary | ICD-10-CM | POA: Diagnosis not present

## 2017-12-02 DIAGNOSIS — Z0000 Encounter for general adult medical examination without abnormal findings: Secondary | ICD-10-CM | POA: Diagnosis not present

## 2017-12-02 MED ORDER — AZELASTINE HCL 0.1 % NA SOLN: 2.0000 | Freq: Two times a day (BID) | NASAL | 12 refills | Status: DC

## 2017-12-02 MED ORDER — BUDESONIDE-FORMOTEROL FUMARATE 160-4.5 MCG/ACT IN AERO: 1.0000 | INHALATION_SPRAY | Freq: Two times a day (BID) | RESPIRATORY_TRACT | 12 refills | Status: DC

## 2017-12-02 MED ORDER — DICLOFENAC SODIUM 1 % TD GEL: 4.0000 g | Freq: Four times a day (QID) | TRANSDERMAL | 11 refills | Status: DC

## 2017-12-02 MED ORDER — BECLOMETHASONE DIPROPIONATE 80 MCG/ACT NA AERS: 1.0000 | INHALATION_SPRAY | Freq: Every day | NASAL | 12 refills | Status: DC

## 2017-12-02 NOTE — Patient Instructions (Addendum)
Thank you for coming in today. Keep track of stress level.  We can help with medicine if needed.   Get fasting labs in the near future.  I think the knee pain is patellar tendonitis or prepatellar bursitis.  Use the diclofenac gel up to 4x daily for pain  Do the exercises especially letting the knee bend slowly against resistance.   Patellar strap can help.

## 2017-12-02 NOTE — Progress Notes (Signed)
Shane Thomas is a 42 y.o. male who presents to [facility]: [place] today for well adult visit.  [name] is doing well overall.  He is exercising and eating a careful diet.  He notes over the last few months he has had increased stress in his life with a new job promotion.  He is now managing a previously failing Marketing executive.  He notes this has increased to stress but he seems to be doing well.  He takes medications listed below notes continued bothersome seasonal allergy symptoms.  No fevers chills nausea vomiting or diarrhea.  Additionally [name] notes pain at the right anterior knee for the last month without injury.  He notes this is been worse with running and bending and kneeling.  He denies any radiating pain weakness or numbness.  Is not tried much treatment aside from ibuprofen.  No fevers or chills.  ROS as above:  Past Medical History:  Diagnosis Date  . Eosinophilic esophagitis 05/16/3788   Status post pill impaction with esophageal rings. Currently [facility]. Note available in Care Everywhere.   Marland Kitchen History of melanoma excision   . Seasonal allergies    Past Surgical History:  Procedure Laterality Date  . ESOPHAGOGASTRODUODENOSCOPY  2409   Eosinophilic esophagitis  . WISDOM TOOTH EXTRACTION     Social History   Tobacco Use  . Smoking status: Never Smoker  . Smokeless tobacco: Current User  Substance Use Topics  . Alcohol use: Yes    Alcohol/week: 2.0 standard drinks    Types: 2 Cans of beer per week    Comment: occassionally   family history includes Hypertension in his father.  Medications: Current Outpatient Medications  Medication Sig Dispense Refill  . AMBULATORY NON FORMULARY MEDICATION Continuous positive airway pressure (CPAP) machine auto-titrate from 4-20 cm of H2O pressure, with all supplemental supplies as  needed. 1 each 0  . azelastine (ASTELIN) 0.1 % nasal spray Place 2 sprays into both nostrils 2 (two) times daily. Use in each nostril as directed 30 mL 12  . Beclomethasone Dipropionate (QNASL) 80 MCG/ACT AERS Place 1 puff into both nostrils daily. 1 Inhaler 12  . budesonide-formoterol (SYMBICORT) 160-4.5 MCG/ACT inhaler Inhale 1 puff into the lungs 2 (two) times daily. 1 Inhaler 12  . levocetirizine (XYZAL) 5 MG tablet TAKE 1 TABLET (5 MG TOTAL) BY MOUTH EVERY EVENING. 30 tablet 5  . montelukast (SINGULAIR) 10 MG tablet TAKE 1 TABLET BY MOUTH AT BEDTIME 30 tablet 1  . omeprazole (PRILOSEC) 40 MG capsule     . diclofenac sodium (VOLTAREN) 1 % GEL Apply 4 g topically 4 (four) times daily. To affected joint. 100 g 11   No current facility-administered medications for this visit.    No Known Allergies  Health Maintenance Health Maintenance  Topic Date Due  . INFLUENZA VACCINE  12/03/2018 (Originally 08/28/2017)  . TETANUS/TDAP  01/15/2027  . HIV Screening  Completed     Exam:  BP 109/73   Pulse 72   Temp 97.7 F (36.5 C) (Oral)   Ht 5\' 11"  (1.803 m)   Wt 187 lb (84.8 kg)   BMI 26.08 kg/m  Wt Readings from Last 5 Encounters:  12/02/17 187 lb (84.8 kg)  06/09/17 190 lb (86.2 kg)  01/14/17 184 lb (83.5 kg)  09/10/16 181 lb (82.1 kg)  04/22/16 189 lb (85.7 kg)      Gen: Well NAD HEENT: EOMI,  MMM Lungs: Normal work of breathing. CTABL Heart: RRR no MRG Abd: NABS, Soft. Nondistended, Nontender Exts: Brisk capillary refill, warm and well perfused.  Psych: Alert alert and oriented normal speech thought process and affect. MSK right knee relatively normal-appearing Normal range of motion. Palpable squeak overlying distal quad tendon patella and patellar tendon. Mildly tender overlying the anterior knee structures.  Nontender otherwise. Stable ligamentous exam. Intact flexion and extension strength.  Depression screen [facility] 2/9 12/02/2017 09/10/2016 08/09/2015  Decreased Interest  0 1 0  Down, Depressed, Hopeless 0 1 0  PHQ - 2 Score 0 2 0  Altered sleeping - 2 -  Tired, decreased energy - 2 -  Feeling bad or failure about yourself  - 0 -  Trouble concentrating - 0 -  Moving slowly or fidgety/restless - 0 -  Suicidal thoughts - 0 -  PHQ-9 Score - 6 -     Assessment and Plan: 42 y.o. male with  Well adult doing reasonably well.  Spent a lot of time discussing his mental health and stress management strategies.  Recheck if not improving.  Additionally will check basic fasting labs to follow-up some mild elevated cholesterol seen previously.   .Additionally patient has some mild right anterior knee pain thought to be prepatellar bursitis or patellar tendinitis.  Plan for home exercise program diclofenac gel and patellar strap.   recheck 1 year from now or sooner if needed.   Orders Placed This Encounter  Procedures  . CBC  . COMPLETE METABOLIC PANEL WITH GFR  . Lipid Panel w/reflex Direct LDL   Meds ordered this encounter  Medications  . azelastine (ASTELIN) 0.1 % nasal spray    Sig: Place 2 sprays into both nostrils 2 (two) times daily. Use in each nostril as directed    Dispense:  30 mL    Refill:  12  . Beclomethasone Dipropionate (QNASL) 80 MCG/ACT AERS    Sig: Place 1 puff into both nostrils daily.    Dispense:  1 Inhaler    Refill:  12  . budesonide-formoterol (SYMBICORT) 160-4.5 MCG/ACT inhaler    Sig: Inhale 1 puff into the lungs 2 (two) times daily.    Dispense:  1 Inhaler    Refill:  12  . diclofenac sodium (VOLTAREN) 1 % GEL    Sig: Apply 4 g topically 4 (four) times daily. To affected joint.    Dispense:  100 g    Refill:  11     Discussed warning signs or symptoms. Please see discharge instructions. Patient expresses understanding.

## 2017-12-03 DIAGNOSIS — M7041 Prepatellar bursitis, right knee: Secondary | ICD-10-CM | POA: Insufficient documentation

## 2017-12-23 ENCOUNTER — Other Ambulatory Visit: Payer: Self-pay | Admitting: Family Medicine

## 2018-02-20 ENCOUNTER — Other Ambulatory Visit: Payer: Self-pay | Admitting: Family Medicine

## 2018-03-09 ENCOUNTER — Encounter: Payer: Self-pay | Admitting: Family Medicine

## 2018-03-09 ENCOUNTER — Ambulatory Visit (INDEPENDENT_AMBULATORY_CARE_PROVIDER_SITE_OTHER): Payer: BLUE CROSS/BLUE SHIELD | Admitting: Family Medicine

## 2018-03-09 VITALS — BP 119/70 | HR 82 | Temp 97.8°F | Wt 192.0 lb

## 2018-03-09 DIAGNOSIS — J0101 Acute recurrent maxillary sinusitis: Secondary | ICD-10-CM | POA: Diagnosis not present

## 2018-03-09 MED ORDER — CEFDINIR 300 MG PO CAPS: 300.0000 mg | ORAL_CAPSULE | Freq: Two times a day (BID) | ORAL | 0 refills | Status: DC

## 2018-03-09 MED ORDER — PREDNISONE 10 MG PO TABS: 30.0000 mg | ORAL_TABLET | Freq: Every day | ORAL | 0 refills | Status: DC

## 2018-03-09 NOTE — Progress Notes (Signed)
Shane Thomas is a 43 y.o. male who presents to [place]: [place] today for nasal congestion and sinus pressure. Shane Thomas has a long history of chronic sinus pain and pressure and congestion.  He is used a variety of antihistamines decongestions listed below which have moderately help to control his symptoms.  He has continued to have nasal congestion.  However over the last 2 weeks or so he is developed worse sinus pain and pressure and discharge and congestion.  He notes he is developed now tooth pain in his upper maxillary area bilaterally.  At this point he has been seen by an allergist and gastroenterology and is willing to consider surgical options if available with ENT.  Additionally he suspects that he may have a sinus infection.   ROS as above:  Exam:  BP 119/70   Pulse 82   Temp 97.8 F (36.6 C) (Oral)   Wt 192 lb (87.1 kg)   SpO2 98%   BMI 26.78 kg/m  Wt Readings from Last 5 Encounters:  03/09/18 192 lb (87.1 kg)  12/02/17 187 lb (84.8 kg)  06/09/17 190 lb (86.2 kg)  01/14/17 184 lb (83.5 kg)  09/10/16 181 lb (82.1 kg)    Gen: Well NAD HEENT: EOMI,  MMM inflamed nasal turbinates bilaterally.  Tender to palpation maxillary sinus bilaterally. Normal posterior pharynx symptomatic membranes.  Mild cervical lymphadenopathy. Lungs: Normal work of breathing. CTABL Heart: RRR no MRG Abd: NABS, Soft. Nondistended, Nontender Exts: Brisk capillary refill, warm and well perfused.   Lab and Radiology Results No results found for this or any previous visit (from the past 72 hour(s)). No results found.    Assessment and Plan: 43 y.o. male with cute sinusitis in the setting of chronic sinus congestion.  Patient failing typical conservative management for allergic rhinitis. Plan to treat acute symptoms with prednisone and Omnicef.  Given the chronicity of his symptoms  and the frequent exacerbations I think a reasonable neck step would be referral to ENT for potential surgical options.  Referral placed as well today.  PDMP not reviewed this encounter. Orders Placed This Encounter  Procedures  . Ambulatory referral to ENT    Referral Priority:   Routine    Referral Type:   Consultation    Referral Reason:   Specialty Services Required    Requested Specialty:   Otolaryngology    Number of Visits Requested:   1   Meds ordered this encounter  Medications  . predniSONE (DELTASONE) 10 MG tablet    Sig: Take 3 tablets (30 mg total) by mouth daily with breakfast.    Dispense:  15 tablet    Refill:  0  . cefdinir (OMNICEF) 300 MG capsule    Sig: Take 1 capsule (300 mg total) by mouth 2 (two) times daily.    Dispense:  14 capsule    Refill:  0     Historical information moved to improve visibility of documentation.  Past Medical History:  Diagnosis Date  . Eosinophilic esophagitis 1/66/0630   Status post pill impaction with esophageal rings. Currently managed [facility] @ [facility]. Note available in Care Everywhere.   Marland Kitchen History of melanoma excision   . Seasonal allergies    Past Surgical History:  Procedure Laterality Date  . ESOPHAGOGASTRODUODENOSCOPY  1601   Eosinophilic esophagitis  . WISDOM TOOTH EXTRACTION     Social History   Tobacco Use  . Smoking status: Never  Smoker  . Smokeless tobacco: Current User  Substance Use Topics  . Alcohol use: Yes    Alcohol/week: 2.0 standard drinks    Types: 2 Cans of beer per week    Comment: occassionally   family history includes Hypertension in his father.  Medications: Current Outpatient Medications  Medication Sig Dispense Refill  . AMBULATORY NON FORMULARY MEDICATION Continuous positive airway pressure (CPAP) machine auto-titrate from 4-20 cm of H2O pressure, with all supplemental supplies as needed. 1 each 0  . azelastine (ASTELIN) 0.1 % nasal spray Place 2 sprays into both  nostrils 2 (two) times daily. Use in each nostril as directed 30 mL 12  . Beclomethasone Dipropionate (QNASL) 80 MCG/ACT AERS Place 1 puff into both nostrils daily. 1 Inhaler 12  . budesonide-formoterol (SYMBICORT) 160-4.5 MCG/ACT inhaler Inhale 1 puff into the lungs 2 (two) times daily. 1 Inhaler 12  . cefdinir (OMNICEF) 300 MG capsule Take 1 capsule (300 mg total) by mouth 2 (two) times daily. 14 capsule 0  . diclofenac sodium (VOLTAREN) 1 % GEL Apply 4 g topically 4 (four) times daily. To affected joint. 100 g 11  . levocetirizine (XYZAL) 5 MG tablet TAKE 1 TABLET (5 MG TOTAL) BY MOUTH EVERY EVENING. 30 tablet 5  . montelukast (SINGULAIR) 10 MG tablet TAKE 1 TABLET BY MOUTH AT BEDTIME 30 tablet 1  . omeprazole (PRILOSEC) 40 MG capsule     . predniSONE (DELTASONE) 10 MG tablet Take 3 tablets (30 mg total) by mouth daily with breakfast. 15 tablet 0   No current facility-administered medications for this visit.    No Known Allergies   Discussed warning signs or symptoms. Please see discharge instructions. Patient expresses understanding.

## 2018-03-09 NOTE — Patient Instructions (Signed)
Thank you for coming in today. Take the prednisone and omnicef.  Continue other medicines.   You should hear from [facility].  If you dont not hear anything you can call [phone].   Let me know if you do not improve.

## 2018-04-22 ENCOUNTER — Encounter: Payer: Self-pay | Admitting: Family Medicine

## 2018-04-22 ENCOUNTER — Other Ambulatory Visit: Payer: Self-pay

## 2018-04-22 ENCOUNTER — Ambulatory Visit (INDEPENDENT_AMBULATORY_CARE_PROVIDER_SITE_OTHER): Payer: BLUE CROSS/BLUE SHIELD | Admitting: Family Medicine

## 2018-04-22 VITALS — Temp 98.7°F | Ht 71.0 in | Wt 185.0 lb

## 2018-04-22 DIAGNOSIS — J3089 Other allergic rhinitis: Secondary | ICD-10-CM

## 2018-04-22 DIAGNOSIS — J0101 Acute recurrent maxillary sinusitis: Secondary | ICD-10-CM

## 2018-04-22 MED ORDER — PREDNISONE 10 MG PO TABS: 30.0000 mg | ORAL_TABLET | Freq: Every day | ORAL | 0 refills | Status: DC

## 2018-04-22 MED ORDER — CEFDINIR 300 MG PO CAPS: 300.0000 mg | ORAL_CAPSULE | Freq: Two times a day (BID) | ORAL | 0 refills | Status: DC

## 2018-04-22 NOTE — Progress Notes (Addendum)
Virtual Visit  via Video or Phone Note  I connected with      [name]  by a telemedicine application and verified that I am speaking with the correct person using two identifiers.   I discussed the limitations of evaluation and management by telemedicine and the availability of in person appointments. The patient expressed understanding and agreed to proceed.  History of Present Illness: Shane Thomas is a 43 y.o. male who would like to discuss sinusitis.  Shane Thomas has a history of recurrent chronic sinus inflammation and congestion with history of recurrent bacterial sinus infections.  He has been having his chronic sinus congestion and runny nose but 4 to 5 days ago worsened.  He notes significant nasal congestion and sinus pain and pressure.  He has been taking his medications as listed below and been adding Sudafed.  He notes this is somewhat helpful.  He notes his symptoms are consistent with previous episodes of bacterial sinus infection.  He denies fevers chills nausea vomiting diarrhea body aches.  He denies any sick contacts.  He denies any contacts with people thought or suspected to have COVID-19.  He denies any recent travel.     Observations/Objective: Temp 98.7 F (37.1 C) (Other (Comment)) Comment (Src): Pt denies fever  Ht 5\' 11"  (1.803 m)   Wt 185 lb (83.9 kg)   BMI 25.80 kg/m  Wt Readings from Last 5 Encounters:  04/22/18 185 lb (83.9 kg)  03/09/18 192 lb (87.1 kg)  12/02/17 187 lb (84.8 kg)  06/09/17 190 lb (86.2 kg)  01/14/17 184 lb (83.5 kg)   Exam: Normal Speech.  Congested sounding voice.  No hoarse voice quality.  No wheezing or shortness of breath or stridor. Normal speech thought process and verbal affect   Assessment and Plan: 43 y.o. male with recurrent sinusitis.  Patient has second sickening and display symptoms of bacterial sinus infection secondary to seasonal allergies.  Plan to maximize allergy medications including Xyzal in the morning  and Zyrtec at night.  Use Benadryl and Sudafed as needed.  Will add prednisone and backup Omnicef antibiotic if not improving.  Watchful waiting and recheck as needed.  Precautions reviewed.  Risk for COVID-19 is extremely low based on symptoms.  I do not think self-isolation is necessary at this time.  PDMP not reviewed this encounter. No orders of the defined types were placed in this encounter.  Meds ordered this encounter  Medications  . cefdinir (OMNICEF) 300 MG capsule    Sig: Take 1 capsule (300 mg total) by mouth 2 (two) times daily.    Dispense:  14 capsule    Refill:  0  . predniSONE (DELTASONE) 10 MG tablet    Sig: Take 3 tablets (30 mg total) by mouth daily with breakfast.    Dispense:  15 tablet    Refill:  0    Follow Up Instructions:    I discussed the assessment and treatment plan with the patient. The patient was provided an opportunity to ask questions and all were answered. The patient agreed with the plan and demonstrated an understanding of the instructions.   The patient was advised to call back or seek an in-person evaluation if the symptoms worsen or if the condition fails to improve as anticipated.  I provided 21 minutes of non-face-to-face time during this encounter.    Historical information moved to improve visibility of documentation.  Past Medical History:  Diagnosis Date  . Eosinophilic esophagitis 3/54/5625  Status post pill impaction with esophageal rings. Currently managed [facility]. Note available in Care Everywhere.   Marland Kitchen History of melanoma excision   . Seasonal allergies    Past Surgical History:  Procedure Laterality Date  . ESOPHAGOGASTRODUODENOSCOPY  5364   Eosinophilic esophagitis  . WISDOM TOOTH EXTRACTION     Social History   Tobacco Use  . Smoking status: Never Smoker  . Smokeless tobacco: Current User  Substance Use Topics  . Alcohol use: Yes    Alcohol/week: 2.0 standard drinks    Types: 2 Cans of  beer per week    Comment: occassionally   family history includes Hypertension in his father.  Medications: Current Outpatient Medications  Medication Sig Dispense Refill  . AMBULATORY NON FORMULARY MEDICATION Continuous positive airway pressure (CPAP) machine auto-titrate from 4-20 cm of H2O pressure, with all supplemental supplies as needed. 1 each 0  . azelastine (ASTELIN) 0.1 % nasal spray Place 2 sprays into both nostrils 2 (two) times daily. Use in each nostril as directed 30 mL 12  . Beclomethasone Dipropionate (QNASL) 80 MCG/ACT AERS Place 1 puff into both nostrils daily. 1 Inhaler 12  . budesonide-formoterol (SYMBICORT) 160-4.5 MCG/ACT inhaler Inhale 1 puff into the lungs 2 (two) times daily. 1 Inhaler 12  . cefdinir (OMNICEF) 300 MG capsule Take 1 capsule (300 mg total) by mouth 2 (two) times daily. 14 capsule 0  . diclofenac sodium (VOLTAREN) 1 % GEL Apply 4 g topically 4 (four) times daily. To affected joint. 100 g 11  . levocetirizine (XYZAL) 5 MG tablet TAKE 1 TABLET (5 MG TOTAL) BY MOUTH EVERY EVENING. 30 tablet 5  . montelukast (SINGULAIR) 10 MG tablet TAKE 1 TABLET BY MOUTH AT BEDTIME 30 tablet 1  . omeprazole (PRILOSEC) 40 MG capsule     . predniSONE (DELTASONE) 10 MG tablet Take 3 tablets (30 mg total) by mouth daily with breakfast. 15 tablet 0   No current facility-administered medications for this visit.    No Known Allergies  PDMP not reviewed this encounter. No orders of the defined types were placed in this encounter.  Meds ordered this encounter  Medications  . cefdinir (OMNICEF) 300 MG capsule    Sig: Take 1 capsule (300 mg total) by mouth 2 (two) times daily.    Dispense:  14 capsule    Refill:  0  . predniSONE (DELTASONE) 10 MG tablet    Sig: Take 3 tablets (30 mg total) by mouth daily with breakfast.    Dispense:  15 tablet    Refill:  0     Addendum to correct incorrect date due to templating error

## 2018-04-22 NOTE — Patient Instructions (Addendum)
Thank you for coming in today.  Take zyzal in the morning and zyrtec in the evening.  Continue nasal sprays.  Add benadryl as needed.  Add pseudofed as needed.  Take prednisone now and back antibiotic if not better.  Keep me updated.   [facility] Sent referral with ov notes and insurance to [facility] at [phone] and [phone]. They will call and schedule with patient for good appointment time.    Sinusitis, Adult Sinusitis is soreness and swelling (inflammation) of your sinuses. Sinuses are hollow spaces in the bones around your face. They are located:  Around your eyes.  In the middle of your forehead.  Behind your nose.  In your cheekbones. Your sinuses and nasal passages are lined with a fluid called mucus. Mucus drains out of your sinuses. Swelling can trap mucus in your sinuses. This lets germs (bacteria, virus, or fungus) grow, which leads to infection. Most of the time, this condition is caused by a virus. What are the causes? This condition is caused by:  Allergies.  Asthma.  Germs.  Things that block your nose or sinuses.  Growths in the nose (nasal polyps).  Chemicals or irritants in the air.  Fungus (rare). What increases the risk? You are more likely to develop this condition if:  You have a weak body defense system (immune system).  You do a lot of swimming or diving.  You use nasal sprays too much.  You smoke. What are the signs or symptoms? The main symptoms of this condition are pain and a feeling of pressure around the sinuses. Other symptoms include:  Stuffy nose (congestion).  Runny nose (drainage).  Swelling and warmth in the sinuses.  Headache.  Toothache.  A cough that may get worse at night.  Mucus that collects in the throat or the back of the nose (postnasal drip).  Being unable to smell and taste.  Being very tired (fatigue).  A fever.  Sore throat.  Bad breath. How is this diagnosed? This condition is diagnosed  based on:  Your symptoms.  Your medical history.  A physical exam.  Tests to find out if your condition is short-term (acute) or long-term (chronic). Your doctor may: ? Check your nose for growths (polyps). ? Check your sinuses using a tool that has a light (endoscope). ? Check for allergies or germs. ? Do imaging tests, such as an MRI or CT scan. How is this treated? Treatment for this condition depends on the cause and whether it is short-term or long-term.  If caused by a virus, your symptoms should go away on their own within 10 days. You may be given medicines to relieve symptoms. They include: ? Medicines that shrink swollen tissue in the nose. ? Medicines that treat allergies (antihistamines). ? A spray that treats swelling of the nostrils. ? Rinses that help get rid of thick mucus in your nose (nasal saline washes).  If caused by bacteria, your doctor may wait to see if you will get better without treatment. You may be given antibiotic medicine if you have: ? A very bad infection. ? A weak body defense system.  If caused by growths in the nose, you may need to have surgery. Follow these instructions at home: Medicines  Take, use, or apply over-the-counter and prescription medicines only as told by your doctor. These may include nasal sprays.  If you were prescribed an antibiotic medicine, take it as told by your doctor. Do not stop taking the antibiotic even if you start  to feel better. Hydrate and humidify   Drink enough water to keep your pee (urine) pale yellow.  Use a cool mist humidifier to keep the humidity level in your home above 50%.  Breathe in steam for 10-15 minutes, 3-4 times a day, or as told by your doctor. You can do this in the bathroom while a hot shower is running.  Try not to spend time in cool or dry air. Rest  Rest as much as you can.  Sleep with your head raised (elevated).  Make sure you get enough sleep each night. General  instructions   Put a warm, moist washcloth on your face 3-4 times a day, or as often as told by your doctor. This will help with discomfort.  Wash your hands often with soap and water. If there is no soap and water, use hand sanitizer.  Do not smoke. Avoid being around people who are smoking (secondhand smoke).  Keep all follow-up visits as told by your doctor. This is important. Contact a doctor if:  You have a fever.  Your symptoms get worse.  Your symptoms do not get better within 10 days. Get help right away if:  You have a very bad headache.  You cannot stop throwing up (vomiting).  You have very bad pain or swelling around your face or eyes.  You have trouble seeing.  You feel confused.  Your neck is stiff.  You have trouble breathing. Summary  Sinusitis is swelling of your sinuses. Sinuses are hollow spaces in the bones around your face.  This condition is caused by tissues in your nose that become inflamed or swollen. This traps germs. These can lead to infection.  If you were prescribed an antibiotic medicine, take it as told by your doctor. Do not stop taking it even if you start to feel better.  Keep all follow-up visits as told by your doctor. This is important. This information is not intended to replace advice given to you by your health care provider. Make sure you discuss any questions you have with your health care provider. Document Released: 07/03/2007 Document Revised: 06/16/2017 Document Reviewed: 06/16/2017 Elsevier Interactive Patient Education  2019 [employer].

## 2018-05-19 ENCOUNTER — Other Ambulatory Visit: Payer: Self-pay | Admitting: Family Medicine

## 2018-07-27 ENCOUNTER — Other Ambulatory Visit: Payer: Self-pay | Admitting: Family Medicine

## 2018-08-05 ENCOUNTER — Other Ambulatory Visit: Payer: Self-pay

## 2018-08-05 ENCOUNTER — Encounter: Payer: Self-pay | Admitting: Family Medicine

## 2018-08-05 ENCOUNTER — Ambulatory Visit (INDEPENDENT_AMBULATORY_CARE_PROVIDER_SITE_OTHER): Payer: BC Managed Care – PPO | Admitting: Family Medicine

## 2018-08-05 VITALS — BP 117/79 | HR 83 | Temp 98.2°F | Ht 71.0 in | Wt 186.0 lb

## 2018-08-05 DIAGNOSIS — K4090 Unilateral inguinal hernia, without obstruction or gangrene, not specified as recurrent: Secondary | ICD-10-CM

## 2018-08-05 NOTE — Progress Notes (Signed)
Shane Thomas is a 43 y.o. male who presents to [facility] today for possible hernia.  Patient notes a bulging in his left groin for the last 2 to 3 weeks.  He notes it is a little painful.  He usually can push it back in.  He notes is worse with straining and standing.  He denies any bowel or bladder problems or problems with sexual function.  He denies any history of hernia.  He notes that on May 15 he fell off a ladder landing on his buttocks.  He has had loss consciousness.  He notes the bulge and started about 3 weeks after that.  He feels completely back to normal aside from the bulging.  He denies any headaches or fogginess or buttocks pain.  No radiating pain weakness or numbness distally.  He is not tried any treatment.  He denies any constipation.     ROS as above:  Exam:  BP 117/79   Pulse 83   Temp 98.2 F (36.8 C) (Oral)   Ht 5\' 11"  (1.803 m)   Wt 186 lb (84.4 kg)   SpO2 100%   BMI 25.94 kg/m  Wt Readings from Last 5 Encounters:  08/05/18 186 lb (84.4 kg)  04/22/18 185 lb (83.9 kg)  03/09/18 192 lb (87.1 kg)  12/02/17 187 lb (84.8 kg)  06/09/17 190 lb (86.2 kg)    Gen: Well NAD HEENT: EOMI,  MMM Lungs: Normal work of breathing. CTABL Heart: RRR no MRG Abd: NABS, Soft. Nondistended, Nontender bulging left inguinal region.  Palpable hernia at left external inguinal ring reducible.  Not particularly tender.  Testicles descended bilaterally nontender.  No penile discharge or lesions. Exts: Brisk capillary refill, warm and well perfused.  Neuropsych: Alert and oriented normal coordination balance and gait.  Thought process and speech are normal.    Assessment and Plan: 43 y.o. male with  Left inguinal hernia.  Small and reducible.  New issue over the last few weeks.  I am not sure how this relates to his fall.  Fortunately he does not seem to have  suffered any injury with his fall.  Discussed hernia mechanism and treatment plan and options.  Plan to refer to general surgery for definitive evaluation and management including possible surgery.  Recheck back with me as needed.  PDMP not reviewed this encounter. Orders Placed This Encounter  Procedures  . Ambulatory referral to General Surgery    Referral Priority:   Routine    Referral Type:   Surgical    Referral Reason:   Specialty Services Required    Requested Specialty:   General Surgery    Number of Visits Requested:   1   No orders of the defined types were placed in this encounter.    Historical information moved to improve visibility of documentation.  Past Medical History:  Diagnosis Date  . Eosinophilic esophagitis 2/58/5277   Status post pill impaction with esophageal rings. [facility]. Note available in Care Everywhere.   Marland Kitchen History of melanoma excision   . Seasonal allergies    Past Surgical History:  Procedure Laterality Date  . ESOPHAGOGASTRODUODENOSCOPY  8242   Eosinophilic esophagitis  . WISDOM TOOTH EXTRACTION     Social History   Tobacco Use  . Smoking status: Never Smoker  . Smokeless tobacco: Current User  Substance Use Topics  . Alcohol use: Yes  Alcohol/week: 2.0 standard drinks    Types: 2 Cans of beer per week    Comment: occassionally   family history includes Hypertension in his father.  Medications: Current Outpatient Medications  Medication Sig Dispense Refill  . AMBULATORY NON FORMULARY MEDICATION Continuous positive airway pressure (CPAP) machine auto-titrate from 4-20 cm of H2O pressure, with all supplemental supplies as needed. 1 each 0  . azelastine (ASTELIN) 0.1 % nasal spray Place 2 sprays into both nostrils 2 (two) times daily. Use in each nostril as directed 30 mL 12  . Beclomethasone Dipropionate (QNASL) 80 MCG/ACT AERS Place 1 puff into both nostrils daily. 1 Inhaler 12  .  budesonide-formoterol (SYMBICORT) 160-4.5 MCG/ACT inhaler Inhale 1 puff into the lungs 2 (two) times daily. 1 Inhaler 12  . levocetirizine (XYZAL) 5 MG tablet TAKE 1 TABLET (5 MG TOTAL) BY MOUTH EVERY EVENING. 30 tablet 5  . montelukast (SINGULAIR) 10 MG tablet TAKE 1 TABLET BY MOUTH EVERYDAY AT BEDTIME 90 tablet 1   No current facility-administered medications for this visit.    No Known Allergies   Discussed warning signs or symptoms. Please see discharge instructions. Patient expresses understanding.

## 2018-08-05 NOTE — Patient Instructions (Signed)
Thank you for coming in today. You should hear from [facility] in [place] soon about hernia.  Let me know if you do not hear anything.    Inguinal Hernia, Adult An inguinal hernia develops when fat or the intestines push through a weak spot in a muscle where your leg meets your lower abdomen (groin). This creates a bulge. This kind of hernia could also be:  In your scrotum, if you are male.  In folds of skin around your vagina, if you are male. There are three types of inguinal hernias:  Hernias that can be pushed back into the abdomen (are reducible). This type rarely causes pain.  Hernias that are not reducible (are incarcerated).  Hernias that are not reducible and lose their blood supply (are strangulated). This type of hernia requires emergency surgery. What are the causes? This condition is caused by having a weak spot in the muscles or tissues in the groin. This weak spot develops over time. The hernia may poke through the weak spot when you suddenly strain your lower abdominal muscles, such as when you:  Lift a heavy object.  Strain to have a bowel movement. Constipation can lead to straining.  Cough. What increases the risk? This condition is more likely to develop in:  Men.  Pregnant women.  People who: ? Are overweight. ? Work in jobs that require long periods of standing or heavy lifting. ? Have had an inguinal hernia before. ? Smoke or have lung disease. These factors can lead to long-lasting (chronic) coughing. What are the signs or symptoms? Symptoms may depend on the size of the hernia. Often, a small inguinal hernia has no symptoms. Symptoms of a larger hernia may include:  A lump in the groin area. This is easier to see when standing. It might not be visible when lying down.  Pain or burning in the groin. This may get worse when lifting, straining, or coughing.  A dull ache or a feeling of pressure in the groin.  In men, an unusual  lump in the scrotum. Symptoms of a strangulated inguinal hernia may include:  A bulge in your groin that is very painful and tender to the touch.  A bulge that turns red or purple.  Fever, nausea, and vomiting.  Inability to have a bowel movement or to pass gas. How is this diagnosed? This condition is diagnosed based on your symptoms, your medical history, and a physical exam. Your health care provider may feel your groin area and ask you to cough. How is this treated? Treatment depends on the size of your hernia and whether you have symptoms. If you do not have symptoms, your health care provider may have you watch your hernia carefully and have you come in for follow-up visits. If your hernia is large or if you have symptoms, you may need surgery to repair the hernia. Follow these instructions at home: Lifestyle  Avoid lifting heavy objects.  Avoid standing for long periods of time.  Do not use any products that contain nicotine or tobacco, such as cigarettes and e-cigarettes. If you need help quitting, ask your health care provider.  Maintain a healthy weight. Preventing constipation  Take actions to prevent constipation. Constipation leads to straining with bowel movements, which can make a hernia worse or cause a hernia repair to break down. Your health care provider may recommend that you: ? Drink enough fluid to keep your urine pale yellow. ? Eat foods that are high in fiber,  such as fresh fruits and vegetables, whole grains, and beans. ? Limit foods that are high in fat and processed sugars, such as fried or sweet foods. ? Take an over-the-counter or prescription medicine for constipation. General instructions  You may try to push the hernia back in place by very gently pressing on it while lying down. Do not try to force the bulge back in if it will not push in easily.  Watch your hernia for any changes in shape, size, or color. Get help right away if you notice any  changes.  Take over-the-counter and prescription medicines only as told by your health care provider.  Keep all follow-up visits as told by your health care provider. This is important. Contact a health care provider if:  You have a fever.  You develop new symptoms.  Your symptoms get worse. Get help right away if:  You have pain in your groin that suddenly gets worse.  You have a bulge in your groin that: ? Suddenly gets bigger and does not get smaller. ? Becomes red or purple or painful to the touch.  You are a man and you have a sudden pain in your scrotum, or the size of your scrotum suddenly changes.  You cannot push the hernia back in place by very gently pressing on it when you are lying down. Do not try to force the bulge back in if it will not push in easily.  You have nausea or vomiting that does not go away.  You have a fast heartbeat.  You cannot have a bowel movement or pass gas. These symptoms may represent a serious problem that is an emergency. Do not wait to see if the symptoms will go away. Get medical help right away. Call your local emergency services (911 in the [place] Summary  An inguinal hernia develops when fat or the intestines push through a weak spot in a muscle where your leg meets your lower abdomen (groin).  This condition is caused by having a weak spot in muscles or tissue in your groin.  Symptoms may depend on the size of the hernia, and they may include pain or swelling in your groin. A small inguinal hernia often has no symptoms.  Treatment may not be needed if you do not have symptoms. If you have symptoms or a large hernia, you may need surgery to repair the hernia.  Avoid lifting heavy objects. Also avoid standing for long amounts of time. This information is not intended to replace advice given to you by your health care provider. Make sure you discuss any questions you have with your health care provider. Document Released: 06/02/2008  Document Revised: 02/15/2017 Document Reviewed: 10/16/2016 Elsevier Patient Education  2020 [employer].

## 2018-08-25 ENCOUNTER — Telehealth: Payer: Self-pay | Admitting: Family Medicine

## 2018-08-25 ENCOUNTER — Ambulatory Visit: Payer: Self-pay | Admitting: General Surgery

## 2018-08-25 DIAGNOSIS — K4090 Unilateral inguinal hernia, without obstruction or gangrene, not specified as recurrent: Secondary | ICD-10-CM | POA: Diagnosis not present

## 2018-08-25 NOTE — Telephone Encounter (Signed)
Patient is having some sinus congestions and wants to know if you could send in something sent in for sinus pressure. He does not have any other symptoms. This has been going on for almost 2 weeks. He has tried OTC Mucinex, and it has not helped. The patients reports that he would like something to help him breathe better like a z-pack. [facility] is correct pharmacy. Please advise.

## 2018-08-26 MED ORDER — CEFDINIR 300 MG PO CAPS: 300.0000 mg | ORAL_CAPSULE | Freq: Two times a day (BID) | ORAL | 0 refills | Status: DC

## 2018-08-26 MED ORDER — PREDNISONE 10 MG PO TABS: 30.0000 mg | ORAL_TABLET | Freq: Every day | ORAL | 0 refills | Status: DC

## 2018-08-26 NOTE — Telephone Encounter (Signed)
Omnicef antibiotic and prednisone anti-inflammatory medication sent to [facility].

## 2018-08-26 NOTE — Telephone Encounter (Signed)
Left pt msg advising of medications

## 2018-09-02 ENCOUNTER — Telehealth: Payer: Self-pay | Admitting: Family Medicine

## 2018-09-02 MED ORDER — SILDENAFIL CITRATE 20 MG PO TABS: 20.0000 mg | ORAL_TABLET | ORAL | 11 refills | Status: DC | PRN

## 2018-09-02 NOTE — Telephone Encounter (Signed)
Sildenafil (Viagra equivalent) refilled.  Prescription sent to [facility] drug.

## 2018-09-02 NOTE — Telephone Encounter (Signed)
-----   Message from Willy Eddy, RN sent at 09/01/2018  4:00 PM EDT ----- Regarding: Epic Error Good afternoon Dr. Georgina Snell,   Do to an issue with Epic the refill order did not go through or was auto cancelled.  We are working with Epic and hope to have this issue resolved by 09/17/18.  We apologize for any inconvenience this may cause.  Please place a new order for the medication.  07/28/2018 SILDENAFIL CITRATE 20 MG PO TABS   Thank you,  Zebedee Iba, RN, BSN Ambulatory Analyst

## 2018-09-08 ENCOUNTER — Ambulatory Visit (INDEPENDENT_AMBULATORY_CARE_PROVIDER_SITE_OTHER): Payer: BC Managed Care – PPO | Admitting: Family Medicine

## 2018-09-08 ENCOUNTER — Encounter: Payer: Self-pay | Admitting: Family Medicine

## 2018-09-08 VITALS — Temp 98.6°F | Ht 71.0 in | Wt 186.0 lb

## 2018-09-08 DIAGNOSIS — J0101 Acute recurrent maxillary sinusitis: Secondary | ICD-10-CM | POA: Diagnosis not present

## 2018-09-08 DIAGNOSIS — J3089 Other allergic rhinitis: Secondary | ICD-10-CM | POA: Diagnosis not present

## 2018-09-08 MED ORDER — CEFDINIR 300 MG PO CAPS: 300.0000 mg | ORAL_CAPSULE | Freq: Two times a day (BID) | ORAL | 0 refills | Status: DC

## 2018-09-08 MED ORDER — PREDNISONE 10 MG PO TABS: 30.0000 mg | ORAL_TABLET | Freq: Every day | ORAL | 0 refills | Status: DC

## 2018-09-08 NOTE — Progress Notes (Signed)
Virtual Visit  via Video Note  I connected with      Ren Dahir by a video enabled telemedicine application and verified that I am speaking with the correct person using two identifiers.   I discussed the limitations of evaluation and management by telemedicine and the availability of in person appointments. The patient expressed understanding and agreed to proceed.  History of Present Illness: Shane Thomas is a 43 y.o. male who would like to discuss nasal congestion.  Patient notes 2 weeks history of worsening nasal congestion associate with discharge.  He has some facial pain and pressure as well.  His symptoms are somewhat consistent previous episodes of bacterial sinus infection.  He is done well in the past with short courses of steroids and antibiotics.  He notes however he keeps having these episodes of recurrent sinusitis.  He notes chronic congestion discharge and allergies that are not improved with maximal medical therapy including nasal steroid sprays, antihistamines, montelukast.  He has been seen by allergist a few years ago but declined doing allergy desensitization protocol as it was going to be very expensive and only may be partially successful.  He does have a history of eosinophilic esophagitis and is interested in possible biologic therapy for his allergy symptoms.  He wonders if Xolair may be helpful or other similar decline antibodies against IgE.   Observations/Objective: Temp 98.6 F (37 C) (Oral)   Ht 5\' 11"  (1.803 m)   Wt 186 lb (84.4 kg)   BMI 25.94 kg/m  Wt Readings from Last 5 Encounters:  09/08/18 186 lb (84.4 kg)  08/05/18 186 lb (84.4 kg)  04/22/18 185 lb (83.9 kg)  03/09/18 192 lb (87.1 kg)  12/02/17 187 lb (84.8 kg)   Exam: Appearance nontoxic appearing Normal Speech.    Lab and Radiology Results No results found for this or any previous visit (from the past 72 hour(s)). No results found.   Assessment and Plan: 43 y.o. male with acute  recurrent bacterial sinusitis.  Plan to treat with Omnicef and prednisone.  However patient has a larger issue of not well controlled chronic allergies and sinus congestion and pressure.  He is achieved maximal medical therapy but not had good symptom resolution.  He is already had some evaluation with allergy.  He is also had evaluation with ENT.  He is interested in possible biologic therapy if possible.  I think is reasonable to refer back to allergy to see if something like Xolair would be helpful.  PDMP not reviewed this encounter. Orders Placed This Encounter  Procedures  . Ambulatory referral to Allergy    Referral Priority:   Routine    Referral Type:   Allergy Testing    Referral Reason:   Specialty Services Required    Requested Specialty:   Allergy    Number of Visits Requested:   1   Meds ordered this encounter  Medications  . predniSONE (DELTASONE) 10 MG tablet    Sig: Take 3 tablets (30 mg total) by mouth daily with breakfast.    Dispense:  15 tablet    Refill:  0  . cefdinir (OMNICEF) 300 MG capsule    Sig: Take 1 capsule (300 mg total) by mouth 2 (two) times daily.    Dispense:  14 capsule    Refill:  0    Follow Up Instructions:    I discussed the assessment and treatment plan with the patient. The patient was provided an opportunity to ask questions  and all were answered. The patient agreed with the plan and demonstrated an understanding of the instructions.   The patient was advised to call back or seek an in-person evaluation if the symptoms worsen or if the condition fails to improve as anticipated.  Time: 15 minutes of intraservice time, with >22 minutes of total time during today's visit.     Will refer allergy for Xolair question.    Historical information moved to improve visibility of documentation.  Past Medical History:  Diagnosis Date  . Eosinophilic esophagitis 0/62/6948   Status post pill impaction with esophageal rings. Currently managed [facility]  [facility]. Note available in Care Everywhere.   Marland Kitchen History of melanoma excision   . Seasonal allergies    Past Surgical History:  Procedure Laterality Date  . ESOPHAGOGASTRODUODENOSCOPY  5462   Eosinophilic esophagitis  . WISDOM TOOTH EXTRACTION     Social History   Tobacco Use  . Smoking status: Never Smoker  . Smokeless tobacco: Current User  Substance Use Topics  . Alcohol use: Yes    Alcohol/week: 2.0 standard drinks    Types: 2 Cans of beer per week    Comment: occassionally   family history includes Hypertension in his father.  Medications: Current Outpatient Medications  Medication Sig Dispense Refill  . levocetirizine (XYZAL) 5 MG tablet TAKE 1 TABLET (5 MG TOTAL) BY MOUTH EVERY EVENING. 30 tablet 5  . montelukast (SINGULAIR) 10 MG tablet TAKE 1 TABLET BY MOUTH EVERYDAY AT BEDTIME 90 tablet 1  . sildenafil (REVATIO) 20 MG tablet Take 1-5 tablets (20-100 mg total) by mouth as needed (Sexual activity). 50 tablet 11  . AMBULATORY NON FORMULARY MEDICATION Continuous positive airway pressure (CPAP) machine auto-titrate from 4-20 cm of H2O pressure, with all supplemental supplies as needed. 1 each 0  . azelastine (ASTELIN) 0.1 % nasal spray Place 2 sprays into both nostrils 2 (two) times daily. Use in each nostril as directed 30 mL 12  . Beclomethasone Dipropionate (QNASL) 80 MCG/ACT AERS Place 1 puff into both nostrils daily. 1 Inhaler 12  . budesonide-formoterol (SYMBICORT) 160-4.5 MCG/ACT inhaler Inhale 1 puff into the lungs 2 (two) times daily. 1 Inhaler 12  . cefdinir (OMNICEF) 300 MG capsule Take 1 capsule (300 mg total) by mouth 2 (two) times daily. 14 capsule 0  . predniSONE (DELTASONE) 10 MG tablet Take 3 tablets (30 mg total) by mouth daily with breakfast. 15 tablet 0   No current facility-administered medications for this visit.    No Known Allergies

## 2018-11-05 ENCOUNTER — Other Ambulatory Visit: Payer: Self-pay

## 2018-11-05 ENCOUNTER — Encounter: Payer: Self-pay | Admitting: Family Medicine

## 2018-11-05 ENCOUNTER — Ambulatory Visit (INDEPENDENT_AMBULATORY_CARE_PROVIDER_SITE_OTHER): Payer: BC Managed Care – PPO | Admitting: Family Medicine

## 2018-11-05 VITALS — Wt 185.0 lb

## 2018-11-05 DIAGNOSIS — R05 Cough: Secondary | ICD-10-CM | POA: Diagnosis not present

## 2018-11-05 DIAGNOSIS — R059 Cough, unspecified: Secondary | ICD-10-CM

## 2018-11-05 MED ORDER — BUDESONIDE-FORMOTEROL FUMARATE 160-4.5 MCG/ACT IN AERO: 1.0000 | INHALATION_SPRAY | Freq: Two times a day (BID) | RESPIRATORY_TRACT | 12 refills | Status: DC

## 2018-11-05 MED ORDER — PREDNISONE 10 MG PO TABS: 30.0000 mg | ORAL_TABLET | Freq: Every day | ORAL | 0 refills | Status: DC

## 2018-11-05 MED ORDER — BENZONATATE 200 MG PO CAPS: 200.0000 mg | ORAL_CAPSULE | Freq: Three times a day (TID) | ORAL | 0 refills | Status: DC | PRN

## 2018-11-05 MED ORDER — CEFDINIR 300 MG PO CAPS: 300.0000 mg | ORAL_CAPSULE | Freq: Two times a day (BID) | ORAL | 0 refills | Status: DC

## 2018-11-05 NOTE — Progress Notes (Signed)
Virtual Visit  via Video Note  I connected with      [name] by a video enabled telemedicine application and verified that I am speaking with the correct person using two identifiers.   I discussed the limitations of evaluation and management by telemedicine and the availability of in person appointments. The patient expressed understanding and agreed to proceed.  History of Present Illness: Shane Thomas is a 43 y.o. male who would like to discuss cough.  Patient has a 3-week history of mild cough.  He notes this is associated with wheezing.  Cough is productive occasionally.  He notes some postnasal drainage but denies any sinus pressure or congestion.  He is planning having hernia surgery on December 10 and is worried that he may have a cough then which would prevent surgery.  He does have a history of Symbicort inhaler but has not been using it.  Observations/Objective: Wt 185 lb (83.9 kg)   BMI 25.80 kg/m  Wt Readings from Last 5 Encounters:  11/05/18 185 lb (83.9 kg)  09/08/18 186 lb (84.4 kg)  08/05/18 186 lb (84.4 kg)  04/22/18 185 lb (83.9 kg)  03/09/18 192 lb (87.1 kg)   Exam: Appearance Normal Speech.  No tachypnea or wheezing.  Lab and Radiology Results No results found for this or any previous visit (from the past 72 hour(s)). No results found.   Assessment and Plan: 42 y.o. male with cough.  Likely bronchitis type.  Plan to use short course of prednisone and Omnicef.  Will use Tessalon Perles as well.  Refilled Symbicort inhaler.  Recheck as needed. Recommend outpatient COVID test as well.  PDMP not reviewed this encounter. No orders of the defined types were placed in this encounter.  No orders of the defined types were placed in this encounter.   Follow Up Instructions:    I discussed the assessment and treatment plan with the patient. The patient was provided an opportunity to ask questions and all were answered. The patient agreed with the plan  and demonstrated an understanding of the instructions.   The patient was advised to call back or seek an in-person evaluation if the symptoms worsen or if the condition fails to improve as anticipated.  Time: 15 minutes of intraservice time, with >22 minutes of total time during today's visit.      Historical information moved to improve visibility of documentation.  Past Medical History:  Diagnosis Date  . Eosinophilic esophagitis [id number]   Status post pill impaction with esophageal rings. Currently managed by Dr Shary Key @ [facility]. Note available in Care Everywhere.   Marland Kitchen History of melanoma excision   . Seasonal allergies    Past Surgical History:  Procedure Laterality Date  . ESOPHAGOGASTRODUODENOSCOPY  123456   Eosinophilic esophagitis  . WISDOM TOOTH EXTRACTION     Social History   Tobacco Use  . Smoking status: Never Smoker  . Smokeless tobacco: Current User  Substance Use Topics  . Alcohol use: Yes    Alcohol/week: 2.0 standard drinks    Types: 2 Cans of beer per week    Comment: occassionally   family history includes Hypertension in his father.  Medications: Current Outpatient Medications  Medication Sig Dispense Refill  . AMBULATORY NON FORMULARY MEDICATION Continuous positive airway pressure (CPAP) machine auto-titrate from 4-20 cm of H2O pressure, with all supplemental supplies as needed. 1 each 0  . azelastine (ASTELIN) 0.1 % nasal spray Place 2 sprays into both nostrils 2 (two)  times daily. Use in each nostril as directed 30 mL 12  . Beclomethasone Dipropionate (QNASL) 80 MCG/ACT AERS Place 1 puff into both nostrils daily. 1 Inhaler 12  . budesonide-formoterol (SYMBICORT) 160-4.5 MCG/ACT inhaler Inhale 1 puff into the lungs 2 (two) times daily. 1 Inhaler 12  . cefdinir (OMNICEF) 300 MG capsule Take 1 capsule (300 mg total) by mouth 2 (two) times daily. 14 capsule 0  . levocetirizine (XYZAL) 5 MG tablet TAKE 1 TABLET (5 MG TOTAL) BY MOUTH EVERY EVENING.  30 tablet 5  . montelukast (SINGULAIR) 10 MG tablet TAKE 1 TABLET BY MOUTH EVERYDAY AT BEDTIME 90 tablet 1  . sildenafil (REVATIO) 20 MG tablet Take 1-5 tablets (20-100 mg total) by mouth as needed (Sexual activity). 50 tablet 11   No current facility-administered medications for this visit.    No Known Allergies

## 2018-12-19 ENCOUNTER — Other Ambulatory Visit: Payer: Self-pay | Admitting: Family Medicine

## 2018-12-23 ENCOUNTER — Other Ambulatory Visit: Payer: Self-pay | Admitting: Family Medicine

## 2019-01-06 ENCOUNTER — Encounter (HOSPITAL_BASED_OUTPATIENT_CLINIC_OR_DEPARTMENT_OTHER): Payer: Self-pay | Admitting: *Deleted

## 2019-01-06 ENCOUNTER — Other Ambulatory Visit: Payer: Self-pay

## 2019-01-09 ENCOUNTER — Other Ambulatory Visit (HOSPITAL_COMMUNITY)
Admission: RE | Admit: 2019-01-09 | Discharge: 2019-01-09 | Disposition: A | Discharge status: Home / Self Care | Payer: BC Managed Care – PPO | Source: Ambulatory Visit | Attending: General Surgery | Admitting: General Surgery

## 2019-01-09 DIAGNOSIS — Z01812 Encounter for preprocedural laboratory examination: Secondary | ICD-10-CM | POA: Insufficient documentation

## 2019-01-09 DIAGNOSIS — Z20828 Contact with and (suspected) exposure to other viral communicable diseases: Secondary | ICD-10-CM | POA: Diagnosis not present

## 2019-01-10 LAB — NOVEL CORONAVIRUS, NAA (HOSP ORDER, SEND-OUT TO REF LAB; TAT 18-24 HRS): SARS-CoV-2, NAA: NOT DETECTED

## 2019-01-12 ENCOUNTER — Ambulatory Visit (HOSPITAL_COMMUNITY): Payer: Self-pay | Admitting: General Surgery

## 2019-01-12 NOTE — Progress Notes (Signed)
Notified pt to come pick up soap/presurgery drink, pt verbalized understanding.

## 2019-01-12 NOTE — Progress Notes (Signed)
      Enhanced Recovery after Surgery for Orthopedics Enhanced Recovery after Surgery is a protocol used to improve the stress on your body and your recovery after surgery.  Patient Instructions  . The night before surgery:  o No food after midnight. ONLY clear liquids after midnight  . The day of surgery (if you do NOT have diabetes):  o Drink ONE (1) Pre-Surgery Clear Ensure as directed.   o This drink was given to you during your hospital  pre-op appointment visit. o The pre-op nurse will instruct you on the time to drink the  Pre-Surgery Ensure depending on your surgery time. o Finish the drink at the designated time by the pre-op nurse.  o Nothing else to drink after completing the  Pre-Surgery Clear Ensure.  . The day of surgery (if you have diabetes): o Drink ONE (1) Gatorade 2 (G2) as directed. o This drink was given to you during your hospital  pre-op appointment visit.  o The pre-op nurse will instruct you on the time to drink the   Gatorade 2 (G2) depending on your surgery time. o Color of the Gatorade may vary. Red is not allowed. o Nothing else to drink after completing the  Gatorade 2 (G2).         If you have questions, please contact your surgeon's office.    CHG soap given to pt. With instructions for use prior to surgery

## 2019-01-13 ENCOUNTER — Ambulatory Visit (HOSPITAL_BASED_OUTPATIENT_CLINIC_OR_DEPARTMENT_OTHER): Payer: BC Managed Care – PPO | Admitting: Anesthesiology

## 2019-01-13 ENCOUNTER — Encounter (HOSPITAL_BASED_OUTPATIENT_CLINIC_OR_DEPARTMENT_OTHER): Payer: Self-pay | Admitting: General Surgery

## 2019-01-13 ENCOUNTER — Ambulatory Visit (HOSPITAL_BASED_OUTPATIENT_CLINIC_OR_DEPARTMENT_OTHER)
Admission: RE | Admit: 2019-01-13 | Discharge: 2019-01-13 | Disposition: A | Discharge status: Home / Self Care | Payer: BC Managed Care – PPO | Attending: General Surgery | Admitting: General Surgery

## 2019-01-13 ENCOUNTER — Other Ambulatory Visit: Payer: Self-pay

## 2019-01-13 ENCOUNTER — Encounter (HOSPITAL_BASED_OUTPATIENT_CLINIC_OR_DEPARTMENT_OTHER)
Admission: RE | Disposition: A | Discharge status: Home / Self Care | Payer: Self-pay | Source: Home / Self Care | Attending: General Surgery

## 2019-01-13 DIAGNOSIS — Z79899 Other long term (current) drug therapy: Secondary | ICD-10-CM | POA: Insufficient documentation

## 2019-01-13 DIAGNOSIS — G4730 Sleep apnea, unspecified: Secondary | ICD-10-CM | POA: Insufficient documentation

## 2019-01-13 DIAGNOSIS — M1990 Unspecified osteoarthritis, unspecified site: Secondary | ICD-10-CM | POA: Insufficient documentation

## 2019-01-13 DIAGNOSIS — K4090 Unilateral inguinal hernia, without obstruction or gangrene, not specified as recurrent: Secondary | ICD-10-CM | POA: Insufficient documentation

## 2019-01-13 DIAGNOSIS — Z7951 Long term (current) use of inhaled steroids: Secondary | ICD-10-CM | POA: Insufficient documentation

## 2019-01-13 DIAGNOSIS — K200 Eosinophilic esophagitis: Secondary | ICD-10-CM | POA: Diagnosis not present

## 2019-01-13 DIAGNOSIS — G8918 Other acute postprocedural pain: Secondary | ICD-10-CM | POA: Diagnosis not present

## 2019-01-13 DIAGNOSIS — D176 Benign lipomatous neoplasm of spermatic cord: Secondary | ICD-10-CM | POA: Insufficient documentation

## 2019-01-13 DIAGNOSIS — J45909 Unspecified asthma, uncomplicated: Secondary | ICD-10-CM | POA: Insufficient documentation

## 2019-01-13 HISTORY — PX: INSERTION OF MESH: SHX5868

## 2019-01-13 HISTORY — PX: INGUINAL HERNIA REPAIR: SHX194

## 2019-01-13 HISTORY — DX: Unspecified asthma, uncomplicated: J45.909

## 2019-01-13 SURGERY — REPAIR, HERNIA, INGUINAL, ADULT
Anesthesia: General | Site: Groin | Laterality: Left

## 2019-01-13 MED ORDER — CELECOXIB 200 MG PO CAPS
ORAL_CAPSULE | ORAL | Status: AC
  Filled 2019-01-13: qty 1

## 2019-01-13 MED ORDER — ONDANSETRON HCL 4 MG/2ML IJ SOLN
INTRAMUSCULAR | Status: AC
  Filled 2019-01-13: qty 2

## 2019-01-13 MED ORDER — CELECOXIB 200 MG PO CAPS
200.0000 mg | ORAL_CAPSULE | ORAL | Status: AC
  Administered 2019-01-13: 200 mg via ORAL

## 2019-01-13 MED ORDER — ACETAMINOPHEN 500 MG PO TABS
1000.0000 mg | ORAL_TABLET | ORAL | Status: AC
  Administered 2019-01-13: 1000 mg via ORAL

## 2019-01-13 MED ORDER — GABAPENTIN 300 MG PO CAPS
300.0000 mg | ORAL_CAPSULE | ORAL | Status: AC
  Administered 2019-01-13: 300 mg via ORAL

## 2019-01-13 MED ORDER — OXYCODONE HCL 5 MG PO TABS
5.0000 mg | ORAL_TABLET | Freq: Once | ORAL | Status: AC | PRN
  Administered 2019-01-13: 5 mg via ORAL

## 2019-01-13 MED ORDER — FENTANYL CITRATE (PF) 100 MCG/2ML IJ SOLN
INTRAMUSCULAR | Status: AC
  Filled 2019-01-13: qty 2

## 2019-01-13 MED ORDER — DEXAMETHASONE SODIUM PHOSPHATE 10 MG/ML IJ SOLN
INTRAMUSCULAR | Status: AC
  Filled 2019-01-13: qty 1

## 2019-01-13 MED ORDER — LIDOCAINE 2% (20 MG/ML) 5 ML SYRINGE
INTRAMUSCULAR | Status: AC
  Filled 2019-01-13: qty 5

## 2019-01-13 MED ORDER — PROPOFOL 10 MG/ML IV BOLUS
INTRAVENOUS | Status: DC | PRN
  Administered 2019-01-13: 200 mg via INTRAVENOUS

## 2019-01-13 MED ORDER — ACETAMINOPHEN 500 MG PO TABS
ORAL_TABLET | ORAL | Status: AC
  Filled 2019-01-13: qty 2

## 2019-01-13 MED ORDER — CHLORHEXIDINE GLUCONATE CLOTH 2 % EX PADS: 6.0000 | MEDICATED_PAD | Freq: Once | CUTANEOUS | Status: DC

## 2019-01-13 MED ORDER — CEFAZOLIN SODIUM-DEXTROSE 2-4 GM/100ML-% IV SOLN
INTRAVENOUS | Status: AC
  Filled 2019-01-13: qty 100

## 2019-01-13 MED ORDER — ONDANSETRON HCL 4 MG/2ML IJ SOLN
INTRAMUSCULAR | Status: DC | PRN
  Administered 2019-01-13: 4 mg via INTRAVENOUS

## 2019-01-13 MED ORDER — BUPIVACAINE HCL (PF) 0.25 % IJ SOLN
INTRAMUSCULAR | Status: DC | PRN
  Administered 2019-01-13: 20 mL

## 2019-01-13 MED ORDER — GABAPENTIN 300 MG PO CAPS
ORAL_CAPSULE | ORAL | Status: AC
  Filled 2019-01-13: qty 1

## 2019-01-13 MED ORDER — LACTATED RINGERS IV SOLN: INTRAVENOUS | Status: DC

## 2019-01-13 MED ORDER — OXYCODONE HCL 5 MG PO TABS
ORAL_TABLET | ORAL | Status: AC
  Filled 2019-01-13: qty 1

## 2019-01-13 MED ORDER — MEPERIDINE HCL 25 MG/ML IJ SOLN: 6.2500 mg | INTRAMUSCULAR | Status: DC | PRN

## 2019-01-13 MED ORDER — BUPIVACAINE HCL 0.5 % IJ SOLN
INTRAMUSCULAR | Status: DC | PRN
  Administered 2019-01-13: 20 mL

## 2019-01-13 MED ORDER — LIDOCAINE HCL (CARDIAC) PF 100 MG/5ML IV SOSY
PREFILLED_SYRINGE | INTRAVENOUS | Status: DC | PRN
  Administered 2019-01-13: 100 mg via INTRAVENOUS

## 2019-01-13 MED ORDER — FENTANYL CITRATE (PF) 100 MCG/2ML IJ SOLN
25.0000 ug | INTRAMUSCULAR | Status: DC | PRN
  Administered 2019-01-13: 50 ug via INTRAVENOUS

## 2019-01-13 MED ORDER — FENTANYL CITRATE (PF) 100 MCG/2ML IJ SOLN
INTRAMUSCULAR | Status: DC | PRN
  Administered 2019-01-13 (×2): 25 ug via INTRAVENOUS
  Administered 2019-01-13: 50 ug via INTRAVENOUS

## 2019-01-13 MED ORDER — MIDAZOLAM HCL 2 MG/2ML IJ SOLN
INTRAMUSCULAR | Status: AC
  Filled 2019-01-13: qty 2

## 2019-01-13 MED ORDER — PROPOFOL 10 MG/ML IV BOLUS
INTRAVENOUS | Status: AC
  Filled 2019-01-13: qty 40

## 2019-01-13 MED ORDER — DEXAMETHASONE SODIUM PHOSPHATE 10 MG/ML IJ SOLN
INTRAMUSCULAR | Status: DC | PRN
  Administered 2019-01-13: 8 mg via INTRAVENOUS

## 2019-01-13 MED ORDER — OXYCODONE HCL 5 MG/5ML PO SOLN: 5.0000 mg | Freq: Once | ORAL | Status: AC | PRN

## 2019-01-13 MED ORDER — HYDROCODONE-ACETAMINOPHEN 5-325 MG PO TABS: 1.0000 | ORAL_TABLET | Freq: Four times a day (QID) | ORAL | 0 refills | Status: DC | PRN

## 2019-01-13 MED ORDER — FENTANYL CITRATE (PF) 100 MCG/2ML IJ SOLN
50.0000 ug | INTRAMUSCULAR | Status: DC | PRN
  Administered 2019-01-13: 100 ug via INTRAVENOUS

## 2019-01-13 MED ORDER — MIDAZOLAM HCL 2 MG/2ML IJ SOLN
1.0000 mg | INTRAMUSCULAR | Status: DC | PRN
  Administered 2019-01-13: 2 mg via INTRAVENOUS

## 2019-01-13 MED ORDER — CEFAZOLIN SODIUM-DEXTROSE 2-4 GM/100ML-% IV SOLN
2.0000 g | INTRAVENOUS | Status: AC
  Administered 2019-01-13: 13:00:00 2 g via INTRAVENOUS

## 2019-01-13 MED ORDER — BUPIVACAINE LIPOSOME 1.3 % IJ SUSP
INTRAMUSCULAR | Status: DC | PRN
  Administered 2019-01-13: 10 mL via PERINEURAL

## 2019-01-13 SURGICAL SUPPLY — 43 items
BLADE CLIPPER SURG (BLADE) IMPLANT
BLADE HEX COATED 2.75 (ELECTRODE) ×2 IMPLANT
BLADE SURG 15 STRL LF DISP TIS (BLADE) ×1 IMPLANT
BLADE SURG 15 STRL SS (BLADE) ×1
CHLORAPREP W/TINT 26 (MISCELLANEOUS) ×2 IMPLANT
COVER BACK TABLE REUSABLE LG (DRAPES) ×2 IMPLANT
COVER MAYO STAND REUSABLE (DRAPES) ×2 IMPLANT
DERMABOND ADVANCED (GAUZE/BANDAGES/DRESSINGS) ×1
DERMABOND ADVANCED .7 DNX12 (GAUZE/BANDAGES/DRESSINGS) ×1 IMPLANT
DRAIN PENROSE 1/2X12 LTX STRL (WOUND CARE) ×2 IMPLANT
DRAPE LAPAROTOMY TRNSV 102X78 (DRAPES) ×2 IMPLANT
DRAPE UTILITY XL STRL (DRAPES) ×2 IMPLANT
ELECT REM PT RETURN 9FT ADLT (ELECTROSURGICAL) ×2
ELECTRODE REM PT RTRN 9FT ADLT (ELECTROSURGICAL) ×1 IMPLANT
GLOVE BIO SURGEON STRL SZ 6.5 (GLOVE) ×1 IMPLANT
GLOVE BIO SURGEON STRL SZ7.5 (GLOVE) ×2 IMPLANT
GLOVE BIOGEL PI IND STRL 7.0 (GLOVE) IMPLANT
GLOVE BIOGEL PI INDICATOR 7.0 (GLOVE) ×1
GOWN STRL REUS W/ TWL LRG LVL3 (GOWN DISPOSABLE) ×2 IMPLANT
GOWN STRL REUS W/TWL LRG LVL3 (GOWN DISPOSABLE) ×2
MESH ULTRAPRO 3X6 7.6X15CM (Mesh General) ×1 IMPLANT
NDL HYPO 25X1 1.5 SAFETY (NEEDLE) ×1 IMPLANT
NEEDLE HYPO 25X1 1.5 SAFETY (NEEDLE) ×2 IMPLANT
NS IRRIG 1000ML POUR BTL (IV SOLUTION) ×2 IMPLANT
PACK BASIN DAY SURGERY FS (CUSTOM PROCEDURE TRAY) ×2 IMPLANT
PENCIL SMOKE EVACUATOR (MISCELLANEOUS) ×2 IMPLANT
SLEEVE SCD COMPRESS KNEE MED (MISCELLANEOUS) ×2 IMPLANT
SPONGE LAP 18X18 RF (DISPOSABLE) ×2 IMPLANT
SUT MON AB 4-0 PC3 18 (SUTURE) ×2 IMPLANT
SUT PROLENE 2 0 SH DA (SUTURE) ×4 IMPLANT
SUT SILK 2 0 SH (SUTURE) ×1 IMPLANT
SUT SILK 3 0 SH 30 (SUTURE) IMPLANT
SUT SILK 3 0 TIES 17X18 (SUTURE) ×1
SUT SILK 3-0 18XBRD TIE BLK (SUTURE) ×1 IMPLANT
SUT VIC AB 0 CT1 27 (SUTURE)
SUT VIC AB 0 CT1 27XBRD ANBCTR (SUTURE) IMPLANT
SUT VIC AB 0 SH 27 (SUTURE) IMPLANT
SUT VIC AB 2-0 SH 27 (SUTURE) ×1
SUT VIC AB 2-0 SH 27XBRD (SUTURE) ×1 IMPLANT
SUT VIC AB 3-0 SH 27 (SUTURE) ×1
SUT VIC AB 3-0 SH 27X BRD (SUTURE) ×1 IMPLANT
SYR CONTROL 10ML LL (SYRINGE) ×2 IMPLANT
TOWEL GREEN STERILE FF (TOWEL DISPOSABLE) ×4 IMPLANT

## 2019-01-13 NOTE — Anesthesia Postprocedure Evaluation (Signed)
Anesthesia Post Note  Patient: Shane Thomas  Procedure(s) Performed: left inguinal hernia repair with mesh (Left Groin) INSERTION OF MESH (Left Groin)     Patient location during evaluation: PACU Anesthesia Type: General Level of consciousness: awake and alert and oriented Pain management: pain level controlled Vital Signs Assessment: post-procedure vital signs reviewed and stable Respiratory status: spontaneous breathing, nonlabored ventilation and respiratory function stable Cardiovascular status: blood pressure returned to baseline and stable Postop Assessment: no apparent nausea or vomiting Anesthetic complications: no    Last Vitals:  Vitals:   01/13/19 1445 01/13/19 1500  BP: (!) 144/89 (!) 124/95  Pulse: 93 77  Resp: 15 17  Temp:    SpO2: 100% 96%    Last Pain:  Vitals:   01/13/19 1445  TempSrc:   PainSc: 5                  Asia Dusenbury A.

## 2019-01-13 NOTE — Op Note (Signed)
01/13/2019  2:21 PM  PATIENT:  Shane Thomas  43 y.o. male  PRE-OPERATIVE DIAGNOSIS:  left inguinal hernia  POST-OPERATIVE DIAGNOSIS:  left inguinal hernia  PROCEDURE:  Procedure(s): left inguinal hernia repair with mesh (Left) INSERTION OF MESH (Left)  SURGEON:  Surgeon(s) and Role:    Jovita Kussmaul, MD - Primary  PHYSICIAN ASSISTANT:   ASSISTANTS: none   ANESTHESIA:   local and general  EBL:  5 mL   BLOOD ADMINISTERED:none  DRAINS: none   LOCAL MEDICATIONS USED:  MARCAINE     SPECIMEN:  No Specimen  DISPOSITION OF SPECIMEN:  N/A  COUNTS:  YES  TOURNIQUET:  * No tourniquets in log *  DICTATION: .Dragon Dictation   After informed consent was obtained the patient was brought to the operating room and placed in the supine position on the operating table.  After adequate induction of general anesthesia the patient's left groin and abdomen were prepped with ChloraPrep, allowed to dry, and draped in usual sterile manner.  An appropriate timeout was performed.  The left groin area was then infiltrated with quarter percent Marcaine.  A small incision was made with a 15 blade knife from the edge of the pubic tubercle on the left towards the anterior superior iliac spine.  The incision was carried through the skin and subcutaneous tissue sharply with the electrocautery until the fascia of the external oblique was encountered.  A small bridging vein was clamped with hemostats, divided, and ligated with 3-0 silk ties.  The external oblique fascia was then opened along its fibers towards the apex of the external ring.  A [place] retractor was deployed.  Blunt dissection was carried out of the cord structures until they could be surrounded between 2 fingers.  Half inch Penrose drain was placed around the cord structures for retraction purposes.  The cord was then gently skeletonized by blunt hemostat dissection.  I was able to identify a large lipoma of the cord as well as a small  hernia sac.  The lipoma was ligated at its base with a 2-0 silk tie and the distal lipoma was divided and removed.  The hernia sac was opened sharply and there were no visceral contents within the sac.  The sac was also ligated at its base with a 2-0 silk suture ligature.  The distal sac was excised and the stump of the sac was allowed to retract back beneath the transversalis muscle.  During the dissection I did look for the ilioinguinal nerve but did not see it in this area.  The vas deferens and testicular artery were identified and spared.  Next a 3 x 6 piece of ultra Pro mesh was chosen and cut to the appropriate size.  The mesh was sewed inferiorly to the shelving edge of the inguinal ligament with a running 2-0 Prolene stitch.  Tails were cut in the mesh laterally.  The tails were wrapped around the cord structures.  Superiorly the mesh was sewed to the musculoaponeurotic strength of the transversalis with interrupted 2-0 Prolene vertical mattress stitches.  Lateral to the cord the tails of the mesh were anchored to the shelving edge of the inguinal ligament with an interrupted 2-0 Prolene stitch.  Once this was accomplished the mesh appeared to be in good position and the hernia seem well repaired.  The wound was irrigated with copious amounts of saline.  The external oblique fascia was then reapproximated with a running 2-0 Vicryl stitch.  The wound was infiltrated with  more quarter percent Marcaine.  The superficial fascia was then closed with a running 3-0 Vicryl stitch.  The skin was closed with a running 4-0 Monocryl subcuticular stitch.  Dermabond dressings were applied.  The patient tolerated the procedure well.  At the end of the case all needle sponge and instrument counts were correct.  The patient was then awakened and taken to recovery in stable condition.  The patient's testicle was in the scrotum at the end of the case.  PLAN OF CARE: Discharge to home after PACU  PATIENT DISPOSITION:  PACU  - hemodynamically stable.   Delay start of Pharmacological VTE agent (>24hrs) due to surgical blood loss or risk of bleeding: not applicable

## 2019-01-13 NOTE — Anesthesia Procedure Notes (Signed)
Procedure Name: LMA Insertion Date/Time: 01/13/2019 1:04 PM Performed by: Raenette Rover, CRNA Pre-anesthesia Checklist: Patient identified, Emergency Drugs available, Suction available and Patient being monitored Patient Re-evaluated:Patient Re-evaluated prior to induction Oxygen Delivery Method: Circle system utilized Preoxygenation: Pre-oxygenation with 100% oxygen Induction Type: IV induction LMA: LMA inserted LMA Size: 4.0 Number of attempts: 1 Placement Confirmation: breath sounds checked- equal and bilateral and positive ETCO2 Tube secured with: Tape Dental Injury: Teeth and Oropharynx as per pre-operative assessment

## 2019-01-13 NOTE — Anesthesia Preprocedure Evaluation (Signed)
Anesthesia Evaluation  Patient identified by MRN, date of birth, ID band Patient awake    Reviewed: Allergy & Precautions, NPO status , Patient's Chart, lab work & pertinent test results  Airway Mallampati: II  TM Distance: >3 FB Neck ROM: Full    Dental no notable dental hx. (+) Teeth Intact   Pulmonary asthma , sleep apnea and Continuous Positive Airway Pressure Ventilation ,    Pulmonary exam normal breath sounds clear to auscultation       Cardiovascular negative cardio ROS Normal cardiovascular exam Rhythm:Regular Rate:Normal     Neuro/Psych PSYCHIATRIC DISORDERS negative neurological ROS     GI/Hepatic negative GI ROS, Neg liver ROS,   Endo/Other  negative endocrine ROS  Renal/GU negative Renal ROS  negative genitourinary   Musculoskeletal  (+) Arthritis , Osteoarthritis,  Left Inguinal Hernia   Abdominal   Peds  Hematology negative hematology ROS (+)   Anesthesia Other Findings   Reproductive/Obstetrics                             Anesthesia Physical Anesthesia Plan  ASA: II  Anesthesia Plan: General   Post-op Pain Management:  Regional for Post-op pain   Induction: Intravenous  PONV Risk Score and Plan: 4 or greater and Ondansetron, Dexamethasone and Treatment may vary due to age or medical condition  Airway Management Planned: LMA  Additional Equipment:   Intra-op Plan:   Post-operative Plan: Extubation in OR  Informed Consent: I have reviewed the patients History and Physical, chart, labs and discussed the procedure including the risks, benefits and alternatives for the proposed anesthesia with the patient or authorized representative who has indicated his/her understanding and acceptance.     Dental advisory given  Plan Discussed with: CRNA and Surgeon  Anesthesia Plan Comments:         Anesthesia Quick Evaluation

## 2019-01-13 NOTE — Anesthesia Procedure Notes (Signed)
Anesthesia Regional Block: TAP block   Pre-Anesthetic Checklist: ,, timeout performed, Correct Patient, Correct Site, Correct Laterality, Correct Procedure, Correct Position, site marked, Risks and benefits discussed,  Surgical consent,  Pre-op evaluation,  At surgeon's request and post-op pain management  Laterality: Left  Prep: chloraprep       Needles:  Injection technique: Single-shot  Needle Type: Echogenic Stimulator Needle     Needle Length: 9cm  Needle Gauge: 21   Needle insertion depth: 7 cm   Additional Needles:   Procedures:,,,, ultrasound used (permanent image in chart),,,,  Narrative:  Start time: 01/13/2019 12:25 PM End time: 01/13/2019 12:30 PM Injection made incrementally with aspirations every 5 mL.  Performed by: Personally  Anesthesiologist: Josephine Igo, MD  Additional Notes:   Timeout performed. Patient sedated. Relevant anatomy ID'd using [place]. Incremental 2-11ml injection of LA with frequent aspiration. Patient tolerated procedure well.        Left TAP Block

## 2019-01-13 NOTE — Transfer of Care (Signed)
Immediate Anesthesia Transfer of Care Note  Patient: Shane Thomas  Procedure(s) Performed: left inguinal hernia repair with mesh (Left Groin) INSERTION OF MESH (Left Groin)  Patient Location: PACU  Anesthesia Type:GA combined with regional for post-op pain  Level of Consciousness: awake, alert , oriented, drowsy and patient cooperative  Airway & Oxygen Therapy: Patient Spontanous Breathing and Patient connected to nasal cannula oxygen  Post-op Assessment: Report given to RN and Post -op Vital signs reviewed and stable  Post vital signs: Reviewed and stable  Last Vitals:  Vitals Value Taken Time  BP 125/89 01/13/19 1422  Temp    Pulse 72 01/13/19 1423  Resp 7 01/13/19 1423  SpO2 97 % 01/13/19 1423  Vitals shown include unvalidated device data.  Last Pain:  Vitals:   01/13/19 1159  TempSrc: Tympanic  PainSc: 0-No pain      Patients Stated Pain Goal: 0 ([id number])  Complications: No apparent anesthesia complications

## 2019-01-13 NOTE — Progress Notes (Signed)
Assisted Dr. Royce Macadamia with left, ultrasound guided, transabdominal plane block. Side rails up, monitors on throughout procedure. See vital signs in flow sheet. Tolerated Procedure well.

## 2019-01-13 NOTE — Discharge Instructions (Signed)
*  No Tylenol until after 5 pm tonight *No Ibuprofen until after 6 pm tonight   Post Anesthesia Home Care Instructions  Activity: Get plenty of rest for the remainder of the day. A responsible individual must stay with you for 24 hours following the procedure.  For the next 24 hours, DO NOT: -Drive a car -Paediatric nurse -Drink alcoholic beverages -Take any medication unless instructed by your physician -Make any legal decisions or sign important papers.  Meals: Start with liquid foods such as gelatin or soup. Progress to regular foods as tolerated. Avoid greasy, spicy, heavy foods. If nausea and/or vomiting occur, drink only clear liquids until the nausea and/or vomiting subsides. Call your physician if vomiting continues.  Special Instructions/Symptoms: Your throat may feel dry or sore from the anesthesia or the breathing tube placed in your throat during surgery. If this causes discomfort, gargle with warm salt water. The discomfort should disappear within 24 hours.  If you had a scopolamine patch placed behind your ear for the management of post- operative nausea and/or vomiting:  1. The medication in the patch is effective for 72 hours, after which it should be removed.  Wrap patch in a tissue and discard in the trash. Wash hands thoroughly with soap and water. 2. You may remove the patch earlier than 72 hours if you experience unpleasant side effects which may include dry mouth, dizziness or visual disturbances. 3. Avoid touching the patch. Wash your hands with soap and water after contact with the patch.    Information for Discharge Teaching: EXPAREL (bupivacaine liposome injectable suspension)   Your surgeon or anesthesiologist gave you EXPAREL(bupivacaine) to help control your pain after surgery.   EXPAREL is a local anesthetic that provides pain relief by numbing the tissue around the surgical site.  EXPAREL is designed to release pain medication over time and can control  pain for up to 72 hours.  Depending on how you respond to EXPAREL, you may require less pain medication during your recovery.  Possible side effects:  Temporary loss of sensation or ability to move in the area where bupivacaine was injected.  Nausea, vomiting, constipation  Rarely, numbness and tingling in your mouth or lips, lightheadedness, or anxiety may occur.  Call your doctor right away if you think you may be experiencing any of these sensations, or if you have other questions regarding possible side effects.  Follow all other discharge instructions given to you by your surgeon or nurse. Eat a healthy diet and drink plenty of water or other fluids.  If you return to the hospital for any reason within 96 hours following the administration of EXPAREL, it is important for health care providers to know that you have received this anesthetic. A teal colored band has been placed on your arm with the date, time and amount of EXPAREL you have received in order to alert and inform your health care providers. Please leave this armband in place for the full 96 hours following administration, and then you may remove the band.

## 2019-01-13 NOTE — Interval H&P Note (Signed)
Risks and benefits of surgery discussed with patient and he understands and wishes to proceed

## 2019-01-13 NOTE — H&P (Signed)
Shane Thomas  Location: [facility] Patient #: [id number] DOB: 1975-09-18 Married / Language: English / Race: White Male   History of Present Illness  The patient is a 43 year old male who presents with an inguinal hernia. We are asked to see the patient in consultation by Dr. Armandina Gemma to evaluate him for a left inguinal hernia. The patient is a 43 year old white male who does a lot of heavy lifting at work. Recently he has been noticing a bulge in the left groin. He denies any pain associated with it. It does cause him a little discomfort when he is been on his feet lifting heavy things all day. He denies any nausea or vomiting. His appetite is good and his bowels are working normally. He does have a history of esophageal stricture that has been dilated and he has trouble taking large pills.   Past Surgical History  No pertinent past surgical history   Diagnostic Studies History  Colonoscopy  never  Allergies  No Known Drug Allergies [08/25/2018]: Allergies Reconciled   Medication History  Montelukast Sodium (10MG  Tablet, Oral) Active. Symbicort (160-4.5MCG/ACT Aerosol, Inhalation) Active. Omeprazole (40MG  Capsule DR, Oral) Active. Medications Reconciled  Social History  Alcohol use  Moderate alcohol use. Caffeine use  Carbonated beverages, Tea. Tobacco use  Never smoker.  Family History  Family history unknown  First Degree Relatives     Review of Systems  General Not Present- Appetite Loss, Chills, Fatigue, Fever, Night Sweats, Weight Gain and Weight Loss. Skin Not Present- Change in Wart/Mole, Dryness, Hives, Jaundice, New Lesions, Non-Healing Wounds, Rash and Ulcer. HEENT Present- Seasonal Allergies and Sinus Pain. Not Present- Earache, Hearing Loss, Hoarseness, Nose Bleed, Oral Ulcers, Ringing in the Ears, Sore Throat, Visual Disturbances, Wears glasses/contact lenses and Yellow Eyes. Respiratory Present- Difficulty Breathing. Not  Present- Bloody sputum, Chronic Cough, Snoring and Wheezing. Breast Not Present- Breast Mass, Breast Pain, Nipple Discharge and Skin Changes. Cardiovascular Present- Difficulty Breathing Lying Down. Not Present- Chest Pain, Leg Cramps, Palpitations, Rapid Heart Rate, Shortness of Breath and Swelling of Extremities. Gastrointestinal Not Present- Abdominal Pain, Bloating, Bloody Stool, Change in Bowel Habits, Chronic diarrhea, Constipation, Difficulty Swallowing, Excessive gas, Gets full quickly at meals, Hemorrhoids, Indigestion, Nausea, Rectal Pain and Vomiting. Male Genitourinary Not Present- Blood in Urine, Change in Urinary Stream, Frequency, Impotence, Nocturia, Painful Urination, Urgency and Urine Leakage. Musculoskeletal Not Present- Back Pain, Joint Pain, Joint Stiffness, Muscle Pain, Muscle Weakness and Swelling of Extremities. Neurological Not Present- Decreased Memory, Fainting, Headaches, Numbness, Seizures, Tingling, Tremor, Trouble walking and Weakness. Psychiatric Not Present- Anxiety, Bipolar, Change in Sleep Pattern, Depression, Fearful and Frequent crying. Endocrine Not Present- Cold Intolerance, Excessive Hunger, Hair Changes, Heat Intolerance, Hot flashes and New Diabetes. Hematology Not Present- Blood Thinners, Easy Bruising, Excessive bleeding, Gland problems, HIV and Persistent Infections.  Vitals Weight: 185.8 lb Height: 71in Body Surface Area: 2.04 m Body Mass Index: 25.91 kg/m  Temp.: 98.16F  Pulse: 87 (Regular)  BP: 122/76 (Sitting, Left Arm, Standard)       Physical Exam  General Mental Status-Alert. General Appearance-Consistent with stated age. Hydration-Well hydrated. Voice-Normal.  Head and Neck Head-normocephalic, atraumatic with no lesions or palpable masses. Trachea-midline. Thyroid Gland Characteristics - normal size and consistency.  Eye Eyeball - Bilateral-Extraocular movements intact. Sclera/Conjunctiva -  Bilateral-No scleral icterus.  Chest and Lung Exam Chest and lung exam reveals -quiet, even and easy respiratory effort with no use of accessory muscles and on auscultation, normal breath sounds, no adventitious sounds  and normal vocal resonance. Inspection Chest Wall - Normal. Back - normal.  Cardiovascular Cardiovascular examination reveals -normal heart sounds, regular rate and rhythm with no murmurs and normal pedal pulses bilaterally.  Abdomen Inspection Inspection of the abdomen reveals - No Hernias. Skin - Scar - no surgical scars. Palpation/Percussion Palpation and Percussion of the abdomen reveal - Soft, Non Tender, No Rebound tenderness, No Rigidity (guarding) and No hepatosplenomegaly. Auscultation Auscultation of the abdomen reveals - Bowel sounds normal.  Male Genitourinary Note: There is a small reducible bulge in the left groin. There is no bulging and minimal impulse with straining in the right groin   Neurologic Neurologic evaluation reveals -alert and oriented x 3 with no impairment of recent or remote memory. Mental Status-Normal.  Musculoskeletal Normal Exam - Left-Upper Extremity Strength Normal and Lower Extremity Strength Normal. Normal Exam - Right-Upper Extremity Strength Normal and Lower Extremity Strength Normal.  Lymphatic Head & Neck  General Head & Neck Lymphatics: Bilateral - Description - Normal. Axillary  General Axillary Region: Bilateral - Description - Normal. Tenderness - Non Tender. Femoral & Inguinal  Generalized Femoral & Inguinal Lymphatics: Bilateral - Description - Normal. Tenderness - Non Tender.    Assessment & Plan  INGUINAL HERNIA OF LEFT SIDE WITHOUT OBSTRUCTION OR GANGRENE (K40.90) Impression: The patient appears to have a small symptomatic left inguinal hernia. Because of the risk of incarceration and strangulation at the Benefit from having this fixed. He would also like to have this done. I have discussed  with him in detail the risks and benefits of the operation as well as some of the technical aspects including the use of mesh and the risk of chronic pain and he understands and wishes to proceed Current Plans Pt Education - Inguinal Hernia: discussed with patient and provided information.

## 2019-01-14 ENCOUNTER — Encounter: Payer: Self-pay | Admitting: *Deleted

## 2019-06-23 ENCOUNTER — Telehealth: Payer: Self-pay | Admitting: Family Medicine

## 2019-06-23 NOTE — Telephone Encounter (Signed)
Please call patient and have him establish with new provider so we can continue his medication refills   Thank you Jenny Reichmann

## 2019-06-23 NOTE — Telephone Encounter (Signed)
Could not get patient on the phone and vm was full.

## 2019-06-30 ENCOUNTER — Other Ambulatory Visit: Payer: Self-pay | Admitting: Family Medicine

## 2019-12-14 ENCOUNTER — Ambulatory Visit (INDEPENDENT_AMBULATORY_CARE_PROVIDER_SITE_OTHER): Payer: Managed Care, Other (non HMO) | Admitting: Family Medicine

## 2019-12-14 ENCOUNTER — Other Ambulatory Visit: Payer: Self-pay

## 2019-12-14 ENCOUNTER — Encounter: Payer: Self-pay | Admitting: Family Medicine

## 2019-12-14 DIAGNOSIS — J4531 Mild persistent asthma with (acute) exacerbation: Secondary | ICD-10-CM

## 2019-12-14 DIAGNOSIS — J3089 Other allergic rhinitis: Secondary | ICD-10-CM

## 2019-12-14 DIAGNOSIS — J4530 Mild persistent asthma, uncomplicated: Secondary | ICD-10-CM | POA: Insufficient documentation

## 2019-12-14 MED ORDER — MONTELUKAST SODIUM 10 MG PO TABS: ORAL_TABLET | ORAL | 1 refills | Status: DC

## 2019-12-14 MED ORDER — BUDESONIDE-FORMOTEROL FUMARATE 160-4.5 MCG/ACT IN AERO: 1.0000 | INHALATION_SPRAY | Freq: Two times a day (BID) | RESPIRATORY_TRACT | 3 refills | Status: DC

## 2019-12-14 MED ORDER — PREDNISONE 20 MG PO TABS: 20.0000 mg | ORAL_TABLET | Freq: Two times a day (BID) | ORAL | 0 refills | Status: DC

## 2019-12-14 MED ORDER — PREDNISONE 20 MG PO TABS: 20.0000 mg | ORAL_TABLET | Freq: Two times a day (BID) | ORAL | 0 refills | Status: AC

## 2019-12-14 NOTE — Patient Instructions (Signed)
Restart symbicort and singulair.  Start prednisone.  Let me know if not improving with this.

## 2019-12-14 NOTE — Assessment & Plan Note (Signed)
Restart symbicort and take singulair regularly.  Add prednisone 20mg  BID x5 days.

## 2019-12-14 NOTE — Progress Notes (Signed)
Shane Thomas - 44 y.o. male MRN [id number]  Date of birth: 04/20/1975  Subjective Chief Complaint  Patient presents with  . URI    HPI Shane Thomas is a 44 y.o. male with history of allergies and asthma.  He had been using symbicort daily for management of reactive airway/asthma however he ran out a couple of months ago.  Usually has flare of symptoms around this time of year.  He has had some tightness in his chest with nasal congestion, watery eyes.  He is taking cetirizine currently as well but hasn't been very good about taking singulair regularly.  He denies fever, chills, body aches, sinus pain, nausea, vomiting, diarrhea.   ROS:  A comprehensive ROS was completed and negative except as noted per HPI  No Known Allergies  Past Medical History:  Diagnosis Date  . Asthma   . Eosinophilic esophagitis 2/70/6237   Status post pill impaction with esophageal rings. Currently managed by [facility]. Note available in Care Everywhere.   Marland Kitchen History of melanoma excision   . Seasonal allergies     Past Surgical History:  Procedure Laterality Date  . ESOPHAGOGASTRODUODENOSCOPY  6283   Eosinophilic esophagitis  . INGUINAL HERNIA REPAIR Left 01/13/2019   Procedure: left inguinal hernia repair with mesh;  Surgeon: Jovita Kussmaul, MD;  Location: [place];  Service: General;  Laterality: Left;  . INSERTION OF MESH Left 01/13/2019   Procedure: INSERTION OF MESH;  Surgeon: Jovita Kussmaul, MD;  Location: [place];  Service: General;  Laterality: Left;  . WISDOM TOOTH EXTRACTION      Social History   Socioeconomic History  . Marital status: Married    Spouse name: Not on file  . Number of children: Not on file  . Years of education: Not on file  . Highest education level: Not on file  Occupational History  . Not on file  Tobacco Use  . Smoking status: Never Smoker  . Smokeless tobacco: Current User    Types: Snuff  Vaping Use  .  Vaping Use: Never used  Substance and Sexual Activity  . Alcohol use: Yes    Alcohol/week: 2.0 standard drinks    Types: 2 Cans of beer per week    Comment: occassionally  . Drug use: No  . Sexual activity: Not on file  Other Topics Concern  . Not on file  Social History Narrative  . Not on file   Social Determinants of Health   Financial Resource Strain:   . Difficulty of Paying Living Expenses: Not on file  Food Insecurity:   . Worried About Charity fundraiser in the Last Year: Not on file  . Ran Out of Food in the Last Year: Not on file  Transportation Needs:   . Lack of Transportation (Medical): Not on file  . Lack of Transportation (Non-Medical): Not on file  Physical Activity:   . Days of Exercise per Week: Not on file  . Minutes of Exercise per Session: Not on file  Stress:   . Feeling of Stress : Not on file  Social Connections:   . Frequency of Communication with Friends and Family: Not on file  . Frequency of Social Gatherings with Friends and Family: Not on file  . Attends Religious Services: Not on file  . Active Member of Clubs or Organizations: Not on file  . Attends Archivist Meetings: Not on file  . Marital Status: Not on  file    Family History  Problem Relation Age of Onset  . Hypertension Father     Health Maintenance  Topic Date Due  . Hepatitis C Screening  Never done  . INFLUENZA VACCINE  04/27/2020 (Originally 08/29/2019)  . COVID-19 Vaccine (1) 12/29/2020 (Originally 06/27/1987)  . TETANUS/TDAP  01/15/2027  . HIV Screening  Completed     ----------------------------------------------------------------------------------------------------------------------------------------------------------------------------------------------------------------- Physical Exam BP (!) 137/98 (BP Location: Left Arm, Patient Position: Sitting, Cuff Size: Normal)   Pulse 89   Temp 98.2 F (36.8 C)   Wt 185 lb (83.9 kg)   SpO2 98%   BMI 25.80 kg/m    Physical Exam Constitutional:      Appearance: Normal appearance.  HENT:     Head: Normocephalic and atraumatic.  Eyes:     General: No scleral icterus. Cardiovascular:     Rate and Rhythm: Normal rate and regular rhythm.  Pulmonary:     Effort: Pulmonary effort is normal.     Breath sounds: Normal breath sounds.  Musculoskeletal:     Cervical back: Neck supple.  Skin:    General: Skin is warm and dry.  Neurological:     Mental Status: He is alert.  Psychiatric:        Mood and Affect: Mood normal.        Behavior: Behavior normal.     ------------------------------------------------------------------------------------------------------------------------------------------------------------------------------------------------------------------- Assessment and Plan  Asthma, mild persistent Restart symbicort and take singulair regularly.  Add prednisone 20mg  BID x5 days.    Allergic rhinitis Continue nasal sprays and antihistamine.    Meds ordered this encounter  Medications  . DISCONTD: budesonide-formoterol (SYMBICORT) 160-4.5 MCG/ACT inhaler    Sig: Inhale 1 puff into the lungs 2 (two) times daily.    Dispense:  6 g    Refill:  3  . DISCONTD: montelukast (SINGULAIR) 10 MG tablet    Sig: TAKE 1 TABLET BY MOUTH EVERYDAY AT BEDTIME    Dispense:  90 tablet    Refill:  1  . DISCONTD: predniSONE (DELTASONE) 20 MG tablet    Sig: Take 1 tablet (20 mg total) by mouth 2 (two) times daily with a meal for 5 days.    Dispense:  10 tablet    Refill:  0  . budesonide-formoterol (SYMBICORT) 160-4.5 MCG/ACT inhaler    Sig: Inhale 1 puff into the lungs 2 (two) times daily.    Dispense:  6 g    Refill:  3  . montelukast (SINGULAIR) 10 MG tablet    Sig: TAKE 1 TABLET BY MOUTH EVERYDAY AT BEDTIME    Dispense:  90 tablet    Refill:  1  . predniSONE (DELTASONE) 20 MG tablet    Sig: Take 1 tablet (20 mg total) by mouth 2 (two) times daily with a meal for 5 days.    Dispense:   10 tablet    Refill:  0    No follow-ups on file.    This visit occurred during the SARS-CoV-2 public health emergency.  Safety protocols were in place, including screening questions prior to the visit, additional usage of staff PPE, and extensive cleaning of exam room while observing appropriate contact time as indicated for disinfecting solutions.

## 2019-12-14 NOTE — Assessment & Plan Note (Signed)
Continue nasal sprays and antihistamine.

## 2019-12-20 ENCOUNTER — Other Ambulatory Visit: Payer: Self-pay | Admitting: Family Medicine

## 2019-12-29 ENCOUNTER — Other Ambulatory Visit: Payer: Self-pay | Admitting: Osteopathic Medicine

## 2019-12-29 ENCOUNTER — Other Ambulatory Visit: Payer: Self-pay | Admitting: Physician Assistant

## 2019-12-31 ENCOUNTER — Other Ambulatory Visit: Payer: Self-pay | Admitting: Neurology

## 2019-12-31 MED ORDER — QNASL 80 MCG/ACT NA AERS: INHALATION_SPRAY | NASAL | 5 refills | Status: DC

## 2020-02-28 ENCOUNTER — Ambulatory Visit (INDEPENDENT_AMBULATORY_CARE_PROVIDER_SITE_OTHER): Payer: 59 | Admitting: Family Medicine

## 2020-02-28 ENCOUNTER — Other Ambulatory Visit: Payer: Self-pay

## 2020-02-28 ENCOUNTER — Encounter: Payer: Self-pay | Admitting: Family Medicine

## 2020-02-28 DIAGNOSIS — J0100 Acute maxillary sinusitis, unspecified: Secondary | ICD-10-CM | POA: Diagnosis not present

## 2020-02-28 MED ORDER — AZITHROMYCIN 250 MG PO TABS: ORAL_TABLET | ORAL | 0 refills | Status: DC

## 2020-02-28 NOTE — Patient Instructions (Addendum)
Start azithromycin.  Continue current medications for allergies and asthma.  Let me know if not clearing up.

## 2020-02-28 NOTE — Assessment & Plan Note (Signed)
Start azithromycin, z-pack dosing.  Recommend increased fluids.  May use decongestant as needed.  Continue current medications for management of allergies.   Contact clinic if symptoms are not resolving.

## 2020-02-28 NOTE — Progress Notes (Signed)
Patient states nasal congestion symptoms started 3 - 4 weeks prior.

## 2020-02-28 NOTE — Progress Notes (Signed)
Shane Thomas - 45 y.o. male MRN [id number]  Date of birth: 04-02-1975  Subjective Chief Complaint  Patient presents with  . Nasal Congestion    HPI Shane Thomas is a 45 y.o. male here today with complaint of congestion.  He reports that this started  About 2-3 weeks ago.  Having nasal congestion with production of thick, yellow mucus.  Has some blood mixed in occasionally.  Also having some sinus pressure.  He denies fever, chills.  Tested + for COVID in late November on home test.  Negative PCR on 01/09/20  ROS:  A comprehensive ROS was completed and negative except as noted per HPI  No Known Allergies  Past Medical History:  Diagnosis Date  . Asthma   . Eosinophilic esophagitis 9/62/9528   Status post pill impaction with esophageal rings. Currently managed by [facility] Sh[facility] @ [facility]. Note available in Care Everywhere.   Marland Kitchen History of melanoma excision   . Seasonal allergies     Past Surgical History:  Procedure Laterality Date  . ESOPHAGOGASTRODUODENOSCOPY  4132   Eosinophilic esophagitis  . INGUINAL HERNIA REPAIR Left 01/13/2019   Procedure: left inguinal hernia repair with mesh;  Surgeon: Jovita Kussmaul, MD;  Location: [place];  Service: General;  Laterality: Left;  . INSERTION OF MESH Left 01/13/2019   Procedure: INSERTION OF MESH;  Surgeon: Jovita Kussmaul, MD;  Location: [place];  Service: General;  Laterality: Left;  . WISDOM TOOTH EXTRACTION      Social History   Socioeconomic History  . Marital status: Married    Spouse name: Not on file  . Number of children: Not on file  . Years of education: Not on file  . Highest education level: Not on file  Occupational History  . Not on file  Tobacco Use  . Smoking status: Never Smoker  . Smokeless tobacco: Current User    Types: Snuff  Vaping Use  . Vaping Use: Never used  Substance and Sexual Activity  . Alcohol use: Yes    Alcohol/week: 2.0 standard drinks    Types: 2  Cans of beer per week    Comment: occassionally  . Drug use: No  . Sexual activity: Not on file  Other Topics Concern  . Not on file  Social History Narrative  . Not on file   Social Determinants of Health   Financial Resource Strain: Not on file  Food Insecurity: Not on file  Transportation Needs: Not on file  Physical Activity: Not on file  Stress: Not on file  Social Connections: Not on file    Family History  Problem Relation Age of Onset  . Hypertension Father     Health Maintenance  Topic Date Due  . Hepatitis C Screening  Never done  . INFLUENZA VACCINE  04/27/2020 (Originally 08/29/2019)  . COVID-19 Vaccine (1) 12/29/2020 (Originally 06/26/1980)  . TETANUS/TDAP  01/15/2027  . HIV Screening  Completed     ----------------------------------------------------------------------------------------------------------------------------------------------------------------------------------------------------------------- Physical Exam BP 119/81 (BP Location: Left Arm, Patient Position: Sitting, Cuff Size: Normal)   Pulse 94   Wt 189 lb (85.7 kg)   SpO2 97%   BMI 26.36 kg/m   Physical Exam Constitutional:      Appearance: Normal appearance.  HENT:     Head: Normocephalic and atraumatic.  Eyes:     General: No scleral icterus. Cardiovascular:     Rate and Rhythm: Normal rate and regular rhythm.  Pulmonary:     Effort: Pulmonary  effort is normal.     Breath sounds: Normal breath sounds.  Musculoskeletal:     Cervical back: Neck supple.  Neurological:     General: No focal deficit present.     Mental Status: He is alert.  Psychiatric:        Mood and Affect: Mood normal.        Behavior: Behavior normal.     ------------------------------------------------------------------------------------------------------------------------------------------------------------------------------------------------------------------- Assessment and Plan  Acute maxillary  sinusitis Start azithromycin, z-pack dosing.  Recommend increased fluids.  May use decongestant as needed.  Continue current medications for management of allergies.   Contact clinic if symptoms are not resolving.     Meds ordered this encounter  Medications  . azithromycin (ZITHROMAX) 250 MG tablet    Sig: Take 2 tabs on day 1 followed by 1 tab daily for days 2-5.    Dispense:  6 tablet    Refill:  0    No follow-ups on file.    This visit occurred during the SARS-CoV-2 public health emergency.  Safety protocols were in place, including screening questions prior to the visit, additional usage of staff PPE, and extensive cleaning of exam room while observing appropriate contact time as indicated for disinfecting solutions.

## 2020-02-29 ENCOUNTER — Telehealth: Payer: Self-pay | Admitting: Family Medicine

## 2020-02-29 ENCOUNTER — Other Ambulatory Visit: Payer: Self-pay | Admitting: Family Medicine

## 2020-02-29 MED ORDER — AZITHROMYCIN 250 MG PO TABS: ORAL_TABLET | ORAL | 0 refills | Status: DC

## 2020-02-29 NOTE — Telephone Encounter (Signed)
Left a message advising of prescription.

## 2020-02-29 NOTE — Telephone Encounter (Signed)
Rx updated.

## 2020-02-29 NOTE — Telephone Encounter (Signed)
Pt called this morning to let us know that the preferred pharmacy should be [facility] on [address]  He was concerned because he thought script for zpac might have been sent to [facility].

## 2020-04-04 ENCOUNTER — Other Ambulatory Visit: Payer: Self-pay

## 2020-04-04 ENCOUNTER — Ambulatory Visit (INDEPENDENT_AMBULATORY_CARE_PROVIDER_SITE_OTHER): Payer: 59 | Admitting: Family Medicine

## 2020-04-04 ENCOUNTER — Encounter: Payer: Self-pay | Admitting: Family Medicine

## 2020-04-04 DIAGNOSIS — J0100 Acute maxillary sinusitis, unspecified: Secondary | ICD-10-CM | POA: Diagnosis not present

## 2020-04-04 MED ORDER — AMOXICILLIN-POT CLAVULANATE 875-125 MG PO TABS: 1.0000 | ORAL_TABLET | Freq: Two times a day (BID) | ORAL | 0 refills | Status: DC

## 2020-04-04 MED ORDER — ALBUTEROL SULFATE HFA 108 (90 BASE) MCG/ACT IN AERS: 2.0000 | INHALATION_SPRAY | Freq: Four times a day (QID) | RESPIRATORY_TRACT | 0 refills | Status: DC | PRN

## 2020-04-04 MED ORDER — PREDNISONE 20 MG PO TABS: 20.0000 mg | ORAL_TABLET | Freq: Two times a day (BID) | ORAL | 0 refills | Status: AC

## 2020-04-04 NOTE — Assessment & Plan Note (Addendum)
Continue allergy medications.  Start augmentin and prednisone burst.  Add albuterol as needed for wheezing.  Increase fluid intake.  Instructed to follow up if symptoms are not improving with prescribed treatment.

## 2020-04-04 NOTE — Progress Notes (Signed)
Marcques Shane Thomas - 45 y.o. male MRN [id number]  Date of birth: [birth year]  Subjective Chief Complaint  Patient presents with   Allergic Rhinitis     HPI Steel Kerney is a 45 y.o. male here today with complaint of congestion, sore throat, sinus pain, and ear pressure.  Symptoms started about 2 days ago. Nasal discharge is thick and yellow and he does have some pain in his upper teeth. He denies fever, chills, body aches, headache, nausea, vomiting, diarrhea.  He has history of allergies and thinks that increase in pollen recently triggered symptoms.    ROS:  A comprehensive ROS was completed and negative except as noted per HPI  No Known Allergies  Past Medical History:  Diagnosis Date   Asthma    Eosinophilic esophagitis 03/19/2540   Status post pill impaction with esophageal rings. Currently managed by [facility] Shary [facility] @ [facility]. Note available in Care Everywhere.    History of melanoma excision    Seasonal allergies     Past Surgical History:  Procedure Laterality Date   ESOPHAGOGASTRODUODENOSCOPY  7062   Eosinophilic esophagitis   INGUINAL HERNIA REPAIR Left 01/13/2019   Procedure: left inguinal hernia repair with mesh;  Surgeon: Jovita Kussmaul, MD;  Location: [place];  Service: General;  Laterality: Left;   INSERTION OF MESH Left 01/13/2019   Procedure: INSERTION OF MESH;  Surgeon: Jovita Kussmaul, MD;  Location: [place];  Service: General;  Laterality: Left;   WISDOM TOOTH EXTRACTION      Social History   Socioeconomic History   Marital status: Married    Spouse name: Not on file   Number of children: Not on file   Years of education: Not on file   Highest education level: Not on file  Occupational History   Not on file  Tobacco Use   Smoking status: Never Smoker   Smokeless tobacco: Current User    Types: Snuff  Vaping Use   Vaping Use: Never used  Substance and Sexual Activity   Alcohol use: Yes     Alcohol/week: 2.0 standard drinks    Types: 2 Cans of beer per week    Comment: occassionally   Drug use: No   Sexual activity: Not on file  Other Topics Concern   Not on file  Social History Narrative   Not on file   Social Determinants of Health   Financial Resource Strain: Not on file  Food Insecurity: Not on file  Transportation Needs: Not on file  Physical Activity: Not on file  Stress: Not on file  Social Connections: Not on file    Family History  Problem Relation Age of Onset   Hypertension Father     Health Maintenance  Topic Date Due   Hepatitis C Screening  Never done   INFLUENZA VACCINE  04/27/2020 (Originally 08/29/2019)   COVID-19 Vaccine (1) 12/29/2020 (Originally 06/26/1980)   TETANUS/TDAP  01/15/2027   HIV Screening  Completed   HPV VACCINES  Aged Out     ----------------------------------------------------------------------------------------------------------------------------------------------------------------------------------------------------------------- Physical Exam BP 123/78 (BP Location: Left Arm, Patient Position: Sitting, Cuff Size: Normal)    Pulse 78    Temp (!) 97.4 F (36.3 C)    Wt 190 lb 3.2 oz (86.3 kg)    SpO2 100%    BMI 26.53 kg/m   Physical Exam Constitutional:      Appearance: Normal appearance.  HENT:     Head: Normocephalic and atraumatic.     Right Ear:  Tympanic membrane normal.     Left Ear: Tympanic membrane normal.     Mouth/Throat:     Comments: Bilateral maxillary sinus tenderness Eyes:     General: No scleral icterus. Cardiovascular:     Rate and Rhythm: Normal rate and regular rhythm.  Pulmonary:     Effort: Pulmonary effort is normal.     Breath sounds: Wheezing present.  Musculoskeletal:     Cervical back: Neck supple.  Neurological:     General: No focal deficit present.     Mental Status: He is alert.  Psychiatric:        Mood and Affect: Mood normal.        Behavior: Behavior normal.      ------------------------------------------------------------------------------------------------------------------------------------------------------------------------------------------------------------------- Assessment and Plan  Acute maxillary sinusitis Continue allergy medications.  Start augmentin and prednisone burst.  Add albuterol as needed for wheezing.  Increase fluid intake.  Instructed to follow up if symptoms are not improving with prescribed treatment.    Meds ordered this encounter  Medications   amoxicillin-clavulanate (AUGMENTIN) 875-125 MG tablet    Sig: Take 1 tablet by mouth 2 (two) times daily.    Dispense:  20 tablet    Refill:  0   predniSONE (DELTASONE) 20 MG tablet    Sig: Take 1 tablet (20 mg total) by mouth 2 (two) times daily with a meal for 5 days.    Dispense:  10 tablet    Refill:  0   albuterol (VENTOLIN HFA) 108 (90 Base) MCG/ACT inhaler    Sig: Inhale 2 puffs into the lungs every 6 (six) hours as needed for wheezing or shortness of breath.    Dispense:  8 g    Refill:  0    No follow-ups on file.    This visit occurred during the SARS-CoV-2 public health emergency.  Safety protocols were in place, including screening questions prior to the visit, additional usage of staff PPE, and extensive cleaning of exam room while observing appropriate contact time as indicated for disinfecting solutions.

## 2020-04-04 NOTE — Patient Instructions (Signed)
Start antibiotic and steroid Increase fluids and add mucinex.  Use albuterol as needed for wheezing or shortness of breath.

## 2020-06-21 ENCOUNTER — Encounter: Payer: Self-pay | Admitting: Family Medicine

## 2020-06-21 ENCOUNTER — Other Ambulatory Visit: Payer: Self-pay

## 2020-06-21 ENCOUNTER — Ambulatory Visit (INDEPENDENT_AMBULATORY_CARE_PROVIDER_SITE_OTHER): Payer: 59 | Admitting: Family Medicine

## 2020-06-21 VITALS — BP 126/84 | HR 84 | Temp 97.4°F | Ht 71.0 in | Wt 185.2 lb

## 2020-06-21 DIAGNOSIS — R198 Other specified symptoms and signs involving the digestive system and abdomen: Secondary | ICD-10-CM | POA: Insufficient documentation

## 2020-06-21 NOTE — Patient Instructions (Signed)
You will be contacted to set up ultrasound.

## 2020-06-21 NOTE — Progress Notes (Signed)
Shane Thomas - 45 y.o. male MRN [id number]  Date of birth: 02-15-1975  Subjective No chief complaint on file.   HPI Shane Thomas is a 45 y.o. male here today with complaint of bulge along R side of abdominal wall.  He first noticed this a few days ago when looking in the mirror.  He denies abdominal pain, nausea, vomiting, bloating or changes to bowels.  He has history of L inguinal hernia repair.   ROS:  A comprehensive ROS was completed and negative except as noted per HPI  No Known Allergies  Past Medical History:  Diagnosis Date  . Asthma   . Eosinophilic esophagitis 3/61/4431   Status post pill impaction with esophageal rings. Currently managed by [facility] Sh[facility]. Note available in Care Everywhere.   Marland Kitchen History of melanoma excision   . Seasonal allergies     Past Surgical History:  Procedure Laterality Date  . ESOPHAGOGASTRODUODENOSCOPY  5400   Eosinophilic esophagitis  . INGUINAL HERNIA REPAIR Left 01/13/2019   Procedure: left inguinal hernia repair with mesh;  Surgeon: Jovita Kussmaul, MD;  Location: [place];  Service: General;  Laterality: Left;  . INSERTION OF MESH Left 01/13/2019   Procedure: INSERTION OF MESH;  Surgeon: Jovita Kussmaul, MD;  Location: [place];  Service: General;  Laterality: Left;  . WISDOM TOOTH EXTRACTION      Social History   Socioeconomic History  . Marital status: Married    Spouse name: Not on file  . Number of children: Not on file  . Years of education: Not on file  . Highest education level: Not on file  Occupational History  . Not on file  Tobacco Use  . Smoking status: Never Smoker  . Smokeless tobacco: Current User    Types: Snuff  Vaping Use  . Vaping Use: Never used  Substance and Sexual Activity  . Alcohol use: Yes    Alcohol/week: 2.0 standard drinks    Types: 2 Cans of beer per week    Comment: occassionally  . Drug use: No  . Sexual activity: Not on file  Other Topics  Concern  . Not on file  Social History Narrative  . Not on file   Social Determinants of Health   Financial Resource Strain: Not on file  Food Insecurity: Not on file  Transportation Needs: Not on file  Physical Activity: Not on file  Stress: Not on file  Social Connections: Not on file    Family History  Problem Relation Age of Onset  . Hypertension Father     Health Maintenance  Topic Date Due  . Hepatitis C Screening  Never done  . COVID-19 Vaccine (1) 12/29/2020 (Originally 06/26/1980)  . INFLUENZA VACCINE  08/28/2020  . TETANUS/TDAP  01/15/2027  . HIV Screening  Completed  . HPV VACCINES  Aged Out     ----------------------------------------------------------------------------------------------------------------------------------------------------------------------------------------------------------------- Physical Exam BP 126/84 (BP Location: Left Arm, Patient Position: Sitting, Cuff Size: Normal)   Pulse 84   Temp (!) 97.4 F (36.3 C)   Ht 5\' 11"  (1.803 m)   Wt 185 lb 3.2 oz (84 kg)   SpO2 99%   BMI 25.83 kg/m   Physical Exam Constitutional:      Appearance: Normal appearance.  Cardiovascular:     Rate and Rhythm: Normal rate and regular rhythm.  Pulmonary:     Effort: Pulmonary effort is normal.     Breath sounds: Normal breath sounds.  Abdominal:  General: Abdomen is flat.     Palpations: Abdomen is soft.     Comments: Asymmetry of abdominal wall with R bulge along oblique area.  Non-tender and no palpable underlying mass.   Musculoskeletal:     Cervical back: Neck supple.  Neurological:     General: No focal deficit present.     Mental Status: He is alert.  Psychiatric:        Mood and Affect: Mood normal.        Behavior: Behavior normal.      ------------------------------------------------------------------------------------------------------------------------------------------------------------------------------------------------------------------- Assessment and Plan  Abdominal wall asymmetry Unclear etiology.  May be due to excessive oblique activation on this side.  No red flag symptoms.  Abdominal US ordered.     No orders of the defined types were placed in this encounter.   No follow-ups on file.    This visit occurred during the SARS-CoV-2 public health emergency.  Safety protocols were in place, including screening questions prior to the visit, additional usage of staff PPE, and extensive cleaning of exam room while observing appropriate contact time as indicated for disinfecting solutions.

## 2020-06-21 NOTE — Assessment & Plan Note (Signed)
Unclear etiology.  May be due to excessive oblique activation on this side.  No red flag symptoms.  Abdominal US ordered.

## 2020-07-10 ENCOUNTER — Other Ambulatory Visit: Payer: 59

## 2020-10-20 ENCOUNTER — Other Ambulatory Visit: Payer: Self-pay | Admitting: Family Medicine

## 2020-12-29 ENCOUNTER — Ambulatory Visit: Payer: 59 | Admitting: Family Medicine

## 2020-12-29 ENCOUNTER — Other Ambulatory Visit: Payer: Self-pay

## 2020-12-29 ENCOUNTER — Encounter: Payer: Self-pay | Admitting: Family Medicine

## 2020-12-29 VITALS — BP 122/81 | HR 100 | Temp 98.0°F | Ht 71.0 in | Wt 189.0 lb

## 2020-12-29 DIAGNOSIS — Z1211 Encounter for screening for malignant neoplasm of colon: Secondary | ICD-10-CM

## 2020-12-29 DIAGNOSIS — J309 Allergic rhinitis, unspecified: Secondary | ICD-10-CM

## 2020-12-29 DIAGNOSIS — H1013 Acute atopic conjunctivitis, bilateral: Secondary | ICD-10-CM

## 2020-12-29 MED ORDER — MONTELUKAST SODIUM 10 MG PO TABS: ORAL_TABLET | ORAL | 0 refills | Status: DC

## 2020-12-29 MED ORDER — METHYLPREDNISOLONE SODIUM SUCC 125 MG IJ SOLR
125.0000 mg | Freq: Once | INTRAMUSCULAR | Status: AC
Start: 2020-12-29 — End: 2020-12-29
  Administered 2020-12-29: 125 mg via INTRAMUSCULAR

## 2020-12-29 MED ORDER — QNASL 80 MCG/ACT NA AERS: INHALATION_SPRAY | NASAL | 0 refills | Status: DC

## 2020-12-29 MED ORDER — AZELASTINE HCL 137 MCG/SPRAY NA SOLN: NASAL | 0 refills | Status: DC

## 2020-12-29 MED ORDER — LEVOCETIRIZINE DIHYDROCHLORIDE 5 MG PO TABS: 5.0000 mg | ORAL_TABLET | Freq: Every evening | ORAL | 5 refills | Status: DC

## 2020-12-29 NOTE — Patient Instructions (Signed)
Steroid shot today Refilling allergy medications  Follow-up if not improving or if symptoms worsen.

## 2020-12-29 NOTE — Progress Notes (Signed)
Acute Office Visit  Subjective:    Patient ID: Shane Thomas, male    DOB: September 13, 1975, 45 y.o.   MRN: [id number]  Chief Complaint  Patient presents with   seasonal allergies    Current allergy medications not working    HPI Patient is in today for worsening allergic rhinitis.   Patient reports history of bad allergies year round, however he seemed to have a relatively mild summer with little to no symptoms. This past week, while traveling to [place] and back to [place], he has had severe allergic rhinitis symptoms - extreme nasal congestion/pressure, sneezing, rhinorrhea, occasional wheezing, trouble sleeping because he can't breathe out of his nose. He had been using mucinex nose spray for a few days but that did  not seem helpful. He did resort to Afrin spray today but knows he cannot use it for more than a couple of days. He has been trying to eat spicy foods to help clear his sinuses.   Reports he has been out of his allergy medicine for several months. States he has been allergy tested in the past and considered allergy shots, but they were going to be too expensive. He is not interested in seeing an allergist.     Past Medical History:  Diagnosis Date   Asthma    Eosinophilic esophagitis 5/78/4696   Status post pill impaction with esophageal rings. Currently managed by [facility] Shary [facility] @ [facility]. Note available in Care Everywhere.    History of melanoma excision    Seasonal allergies     Past Surgical History:  Procedure Laterality Date   ESOPHAGOGASTRODUODENOSCOPY  2952   Eosinophilic esophagitis   INGUINAL HERNIA REPAIR Left 01/13/2019   Procedure: left inguinal hernia repair with mesh;  Surgeon: Jovita Kussmaul, MD;  Location: [place];  Service: General;  Laterality: Left;   INSERTION OF MESH Left 01/13/2019   Procedure: INSERTION OF MESH;  Surgeon: Jovita Kussmaul, MD;  Location: [place];  Service: General;  Laterality: Left;   WISDOM  TOOTH EXTRACTION      Family History  Problem Relation Age of Onset   Hypertension Father     Social History   Socioeconomic History   Marital status: Married    Spouse name: Not on file   Number of children: Not on file   Years of education: Not on file   Highest education level: Not on file  Occupational History   Not on file  Tobacco Use   Smoking status: Never   Smokeless tobacco: Current    Types: Snuff  Vaping Use   Vaping Use: Never used  Substance and Sexual Activity   Alcohol use: Yes    Alcohol/week: 2.0 standard drinks    Types: 2 Cans of beer per week    Comment: occassionally   Drug use: No   Sexual activity: Not on file  Other Topics Concern   Not on file  Social History Narrative   Not on file   Social Determinants of Health   Financial Resource Strain: Not on file  Food Insecurity: Not on file  Transportation Needs: Not on file  Physical Activity: Not on file  Stress: Not on file  Social Connections: Not on file  Intimate Partner Violence: Not on file    Outpatient Medications Prior to Visit  Medication Sig Dispense Refill   albuterol (VENTOLIN HFA) 108 (90 Base) MCG/ACT inhaler Inhale 2 puffs into the lungs every 6 (six) hours as needed  for wheezing or shortness of breath. 8 g 0   budesonide-formoterol (SYMBICORT) 160-4.5 MCG/ACT inhaler Inhale 1 puff into the lungs 2 (two) times daily. 6 g 3   Azelastine HCl 137 MCG/SPRAY SOLN USE 2 SPRAY(S) IN EACH NOSTRIL TWICE DAILY AS DIRECTED 30 mL 0   Beclomethasone Dipropionate (QNASL) 80 MCG/ACT AERS Use 1 spray(s) in each nostril once daily 11 g 0   levocetirizine (XYZAL) 5 MG tablet TAKE 1 TABLET (5 MG TOTAL) BY MOUTH EVERY EVENING. 30 tablet 5   montelukast (SINGULAIR) 10 MG tablet TAKE 1 TABLET BY MOUTH ONCE DAILY AT BEDTIME 90 tablet 0   No facility-administered medications prior to visit.    No Known Allergies  Review of Systems All review of systems negative except what is listed in the  HPI     Objective:    Physical Exam Vitals reviewed.  Constitutional:      Appearance: Normal appearance. He is normal weight.  HENT:     Head: Normocephalic and atraumatic.     Right Ear: Tympanic membrane normal.     Left Ear: Tympanic membrane normal.     Nose: Congestion and rhinorrhea present.     Mouth/Throat:     Mouth: Mucous membranes are moist.     Pharynx: Oropharynx is clear.  Eyes:     Extraocular Movements: Extraocular movements intact.     Comments: Mild bilateral allergic conjunctivitis  Cardiovascular:     Rate and Rhythm: Normal rate and regular rhythm.     Pulses: Normal pulses.     Heart sounds: Normal heart sounds.  Pulmonary:     Effort: Pulmonary effort is normal.     Breath sounds: Normal breath sounds.  Musculoskeletal:     Cervical back: Normal range of motion and neck supple.  Skin:    General: Skin is warm and dry.  Neurological:     General: No focal deficit present.     Mental Status: He is alert and oriented to person, place, and time. Mental status is at baseline.  Psychiatric:        Mood and Affect: Mood normal.        Behavior: Behavior normal.        Thought Content: Thought content normal.        Judgment: Judgment normal.    BP 122/81 (BP Location: Left Arm, Patient Position: Sitting, Cuff Size: Normal)   Pulse 100   Temp 98 F (36.7 C) (Oral)   Ht 5\' 11"  (1.803 m)   Wt 189 lb (85.7 kg)   SpO2 98%   BMI 26.36 kg/m  Wt Readings from Last 3 Encounters:  12/29/20 189 lb (85.7 kg)  06/21/20 185 lb 3.2 oz (84 kg)  04/04/20 190 lb 3.2 oz (86.3 kg)    Health Maintenance Due  Topic Date Due   Hepatitis C Screening  Never done   COLONOSCOPY (Pts 45-65yrs Insurance coverage will need to be confirmed)  Never done    There are no preventive care reminders to display for this patient.   Lab Results  Component Value Date   TSH 1.90 03/18/2016   Lab Results  Component Value Date   WBC 10.1 03/18/2016   HGB 16.1 03/18/2016    HCT 47.1 03/18/2016   MCV 89.7 03/18/2016   PLT 223 03/18/2016   Lab Results  Component Value Date   NA 141 03/18/2016   K 4.6 03/18/2016   CO2 30 03/18/2016   GLUCOSE 81 03/18/2016  BUN 14 03/18/2016   CREATININE 1.04 03/18/2016   BILITOT 1.1 03/18/2016   ALKPHOS 62 03/18/2016   AST 22 03/18/2016   ALT 22 03/18/2016   PROT 7.1 03/18/2016   ALBUMIN 4.3 03/18/2016   CALCIUM 9.4 03/18/2016   Lab Results  Component Value Date   CHOL 171 03/18/2016   Lab Results  Component Value Date   HDL 33 (L) 03/18/2016   No results found for: [facility] Lab Results  Component Value Date   TRIG 129 03/18/2016   Lab Results  Component Value Date   CHOLHDL 5.2 (H) 03/18/2016   No results found for: HGBA1C     Assessment & Plan:    1. Screening for colon cancer - Cologuard  2. Allergic conjunctivitis and rhinitis, bilateral Refills placed on allergy regimen for him to resume (xyzal, Qnasl spray, Astelin, Singulair). Offered short course of oral prednisone, but patient preferred injection - solumedrol given today. Continue supportive measures and symptoms management. Aware of signs and symptoms requiring further/urgent evaluation.  - levocetirizine (XYZAL) 5 MG tablet; Take 1 tablet (5 mg total) by mouth every evening.  Dispense: 30 tablet; Refill: 5 - Beclomethasone Dipropionate (QNASL) 80 MCG/ACT AERS; Use 1 spray(s) in each nostril once daily  Dispense: 11 g; Refill: 0 - Azelastine HCl 137 MCG/SPRAY SOLN; USE 2 SPRAY(S) IN EACH NOSTRIL TWICE DAILY AS DIRECTED  Dispense: 30 mL; Refill: 0 - montelukast (SINGULAIR) 10 MG tablet; TAKE 1 TABLET BY MOUTH ONCE DAILY AT BEDTIME  Dispense: 90 tablet; Refill: 0 - methylPREDNISolone sodium succinate (SOLU-MEDROL) 125 mg/2 mL injection 125 mg   Follow-up as needed.   Purcell Nails Olevia Bowens, DNP, FNP-C

## 2021-01-26 ENCOUNTER — Other Ambulatory Visit: Payer: Self-pay | Admitting: Family Medicine

## 2021-01-26 DIAGNOSIS — H1013 Acute atopic conjunctivitis, bilateral: Secondary | ICD-10-CM

## 2021-02-28 ENCOUNTER — Telehealth (INDEPENDENT_AMBULATORY_CARE_PROVIDER_SITE_OTHER): Payer: 59 | Admitting: Physician Assistant

## 2021-02-28 ENCOUNTER — Encounter: Payer: Self-pay | Admitting: Physician Assistant

## 2021-02-28 ENCOUNTER — Other Ambulatory Visit: Payer: Self-pay

## 2021-02-28 ENCOUNTER — Telehealth: Payer: Self-pay

## 2021-02-28 VITALS — Temp 98.9°F | Ht 67.0 in | Wt 183.0 lb

## 2021-02-28 DIAGNOSIS — J40 Bronchitis, not specified as acute or chronic: Secondary | ICD-10-CM

## 2021-02-28 DIAGNOSIS — J4531 Mild persistent asthma with (acute) exacerbation: Secondary | ICD-10-CM

## 2021-02-28 DIAGNOSIS — T7840XA Allergy, unspecified, initial encounter: Secondary | ICD-10-CM | POA: Insufficient documentation

## 2021-02-28 DIAGNOSIS — T7840XD Allergy, unspecified, subsequent encounter: Secondary | ICD-10-CM

## 2021-02-28 DIAGNOSIS — J309 Allergic rhinitis, unspecified: Secondary | ICD-10-CM | POA: Insufficient documentation

## 2021-02-28 DIAGNOSIS — J329 Chronic sinusitis, unspecified: Secondary | ICD-10-CM

## 2021-02-28 DIAGNOSIS — H1013 Acute atopic conjunctivitis, bilateral: Secondary | ICD-10-CM

## 2021-02-28 MED ORDER — BUDESONIDE-FORMOTEROL FUMARATE 160-4.5 MCG/ACT IN AERO: 1.0000 | INHALATION_SPRAY | Freq: Two times a day (BID) | RESPIRATORY_TRACT | 3 refills | Status: DC

## 2021-02-28 MED ORDER — PREDNISONE 20 MG PO TABS: 20.0000 mg | ORAL_TABLET | Freq: Two times a day (BID) | ORAL | 0 refills | Status: DC

## 2021-02-28 MED ORDER — QNASL 80 MCG/ACT NA AERS: INHALATION_SPRAY | NASAL | 11 refills | Status: DC

## 2021-02-28 MED ORDER — LEVOCETIRIZINE DIHYDROCHLORIDE 5 MG PO TABS: 5.0000 mg | ORAL_TABLET | Freq: Every evening | ORAL | 3 refills | Status: DC

## 2021-02-28 MED ORDER — MONTELUKAST SODIUM 10 MG PO TABS: ORAL_TABLET | ORAL | 3 refills | Status: DC

## 2021-02-28 MED ORDER — AZELASTINE HCL 137 MCG/SPRAY NA SOLN: NASAL | 11 refills | Status: DC

## 2021-02-28 MED ORDER — AMOXICILLIN 875 MG PO TABS: 875.0000 mg | ORAL_TABLET | Freq: Two times a day (BID) | ORAL | 0 refills | Status: AC

## 2021-02-28 NOTE — Telephone Encounter (Signed)
Please schedule OV.  Thanks!

## 2021-02-28 NOTE — Telephone Encounter (Signed)
Pt was seen via [facility] by Iran Planas at 3pm

## 2021-02-28 NOTE — Progress Notes (Signed)
Dry cough and nasal congestion and wheezing watery eyes and sore throat x 3 weeks, pt reports no fever , pt needs rx fill on symbicort

## 2021-02-28 NOTE — Telephone Encounter (Signed)
Pt lvm stating he had a cough x 3 weeks and wanted to speak to Dr. Zigmund Daniel about medication.   Please call pt and schedule him with next available for cough. Thank you

## 2021-02-28 NOTE — Progress Notes (Signed)
..Virtual Visit via Video Note  I connected with Shane Thomas on 03/02/21 at  3:00 PM EST by a video enabled telemedicine application and verified that I am speaking with the correct person using two identifiers.  Location: Patient: home Provider: clinic  .Marland KitchenParticipating in visit:  Patient: Shane Thomas Provider: Iran Planas PA-C   I discussed the limitations of evaluation and management by telemedicine and the availability of in person appointments. The patient expressed understanding and agreed to proceed.  History of Present Illness: Pt is a 46 yo male with asthma, allergies, OSA  who needs refills on symbicort and to discuss dry cough for last 3 weeks. He cannot seem to clear this exacerbation. He is using his rescue inhaler more frequently. He denies any fever, chills, body aches. His chest does feel tight at times. He has some sinus pressure and drainage. He feels congested in head and chest. He has taken OTC cold and cough medications with some relief but not significantly better.   .. Active Ambulatory Problems    Diagnosis Date Noted   Allergic rhinitis 09/01/2014   Tobacco chew use 09/01/2014   Cough 12/28/2014   Low libido 08/09/2015   Fatigue 08/09/2015   Wheezing 41/28/7867   Eosinophilic esophagitis 67/20/9470   OSA (obstructive sleep apnea) 10/03/2016   Prepatellar bursitis of right knee 12/03/2017   Asthma, mild persistent 12/14/2019   Acute maxillary sinusitis 02/28/2020   Abdominal wall asymmetry 06/21/2020   Allergies 02/28/2021   Mild persistent asthma with acute exacerbation 02/28/2021   Allergic conjunctivitis and rhinitis, bilateral 02/28/2021   Resolved Ambulatory Problems    Diagnosis Date Noted   ANKLE PAIN, LEFT 12/06/2009   FOOT PAIN, LEFT 10/11/2009   Contusion of foot 10/11/2009   Acute bronchitis 12/28/2014   Past Medical History:  Diagnosis Date   Asthma    History of melanoma excision    Seasonal allergies         Observations/Objective: No acute distress Normal breathing Dry cough noted  .Marland Kitchen Today's Vitals   02/28/21 1452  Temp: 98.9 F (37.2 C)  TempSrc: Oral  Weight: 183 lb (83 kg)  Height: 5\' 7"  (1.702 m)   Body mass index is 28.66 kg/m.    Assessment and Plan: Marland KitchenMarland KitchenFerman was seen today for cough.  Diagnoses and all orders for this visit:  Sinobronchitis -     predniSONE (DELTASONE) 20 MG tablet; Take 1 tablet (20 mg total) by mouth 2 (two) times daily with a meal. For 5 days. -     amoxicillin (AMOXIL) 875 MG tablet; Take 1 tablet (875 mg total) by mouth 2 (two) times daily for 10 days.  Allergic conjunctivitis and rhinitis, bilateral -     montelukast (SINGULAIR) 10 MG tablet; TAKE 1 TABLET BY MOUTH ONCE DAILY AT BEDTIME -     levocetirizine (XYZAL) 5 MG tablet; Take 1 tablet (5 mg total) by mouth every evening. -     Beclomethasone Dipropionate (QNASL) 80 MCG/ACT AERS; Use 1 spray(s) in each nostril once daily -     Azelastine HCl 137 MCG/SPRAY SOLN; USE 2 SPRAY(S) IN EACH NOSTRIL TWICE DAILY AS DIRECTED  Mild persistent asthma with acute exacerbation -     budesonide-formoterol (SYMBICORT) 160-4.5 MCG/ACT inhaler; Inhale 1 puff into the lungs 2 (two) times daily.  Allergy, subsequent encounter   Refilled asthma and allergy medications. Amoxil and prednisone sent for sinobronchitis. Follow up as needed or if symptoms persist.     Follow Up Instructions:  I discussed the assessment and treatment plan with the patient. The patient was provided an opportunity to ask questions and all were answered. The patient agreed with the plan and demonstrated an understanding of the instructions.   The patient was advised to call back or seek an in-person evaluation if the symptoms worsen or if the condition fails to improve as anticipated.    Shane Planas, PA-C

## 2021-06-27 ENCOUNTER — Other Ambulatory Visit: Payer: Self-pay

## 2021-06-27 DIAGNOSIS — Z1211 Encounter for screening for malignant neoplasm of colon: Secondary | ICD-10-CM

## 2021-06-28 ENCOUNTER — Encounter: Payer: Self-pay | Admitting: Family Medicine

## 2021-06-28 ENCOUNTER — Ambulatory Visit: Payer: Self-pay | Admitting: Family Medicine

## 2021-06-28 ENCOUNTER — Ambulatory Visit: Payer: 59 | Admitting: Family Medicine

## 2021-06-28 VITALS — BP 113/55 | HR 78 | Ht 67.0 in | Wt 188.0 lb

## 2021-06-28 DIAGNOSIS — Z1211 Encounter for screening for malignant neoplasm of colon: Secondary | ICD-10-CM | POA: Diagnosis not present

## 2021-06-28 DIAGNOSIS — R1900 Intra-abdominal and pelvic swelling, mass and lump, unspecified site: Secondary | ICD-10-CM | POA: Diagnosis not present

## 2021-06-28 NOTE — Progress Notes (Signed)
   Acute Office Visit  Subjective:     Patient ID: Shane Thomas, male    DOB: 1975/08/27, 46 y.o.   MRN: [id number]  Chief Complaint  Patient presents with   Abdominal Pain    HPI Patient is in today for bulge on stomach wall. Has been there for > 1 year.  Note in chart from May of 2022. Off feels heavy and gets some discomfort. More distended after eating.  Noticed it more 2 weeks ago.  No gas or GERD.  He travels a lot.  He says after he goes to the bathroom and actually seems less distended.  ROS      Objective:    BP (!) 113/55   Pulse 78   Ht '5\' 7"'$  (1.702 m)   Wt 188 lb (85.3 kg)   SpO2 98%   BMI 29.44 kg/m    Physical Exam Vitals reviewed.  Constitutional:      Appearance: He is well-developed.  HENT:     Head: Normocephalic and atraumatic.  Eyes:     Conjunctiva/sclera: Conjunctivae normal.  Cardiovascular:     Rate and Rhythm: Normal rate.  Pulmonary:     Effort: Pulmonary effort is normal.  Abdominal:     General: Abdomen is flat.     Tenderness: There is no abdominal tenderness. There is no guarding or rebound.     Comments: Slightly more full on the right side.  I palpable defect in the wall. Nontender.   Skin:    General: Skin is dry.     Coloration: Skin is not pale.  Neurological:     Mental Status: He is alert and oriented to person, place, and time.  Psychiatric:        Behavior: Behavior normal.    No results found for any visits on 06/28/21.      Assessment & Plan:   Problem List Items Addressed This Visit   None Visit Diagnoses     Screening for colon cancer    -  Primary   Relevant Orders   Cologuard   Abdominal wall bulge       Relevant Orders   CT Abdomen Pelvis Wo Contrast       Abdominal wall bulge-we discussed options.  I suspect that it could be a hernia I do not feel a discrete defect in the wall.  We can either move forward with surgical consultation or CT for further work-up he is amenable to doing the CT first.   Will get CT authorized with insurance  Cologuard ordered for colon cancer screening.   No orders of the defined types were placed in this encounter.   No follow-ups on file.  Beatrice Lecher, MD

## 2021-06-28 NOTE — Progress Notes (Signed)
Pt reports that his sxs began 2 weeks ago. He stated that he feels a "heaviness" and some slight discomfort on the R side of his abdomen.   He said that he notices that sometimes he notices a "bulge" there after hours eating a meal.   He denies any F/S/C/N/V/D. He does have normal BM's, no gas or gerd unless he eats something spicy.   He has been taking Metamucil and an Immunity/energy drink for the past year.   Pt also travels a lot

## 2021-07-02 ENCOUNTER — Ambulatory Visit (INDEPENDENT_AMBULATORY_CARE_PROVIDER_SITE_OTHER): Payer: 59 | Admitting: Sports Medicine

## 2021-07-02 ENCOUNTER — Ambulatory Visit (INDEPENDENT_AMBULATORY_CARE_PROVIDER_SITE_OTHER): Payer: 59

## 2021-07-02 DIAGNOSIS — M180 Bilateral primary osteoarthritis of first carpometacarpal joints: Secondary | ICD-10-CM | POA: Insufficient documentation

## 2021-07-02 MED ORDER — MELOXICAM 15 MG PO TABS: ORAL_TABLET | ORAL | 3 refills | Status: DC

## 2021-07-02 NOTE — Assessment & Plan Note (Signed)
Pleasant 46 year old male, increasing pain thumb basal joint bilaterally. On exam he does have the typical squared off appearance of the thumb basal joint, we discussed the pathophysiology. We will start conservatively with meloxicam, bilateral x-rays, thumb basal joint home physical therapy, as well as he will get a thumb loop over-the-counter. Return to see me in about 4 to 6 weeks and will consider injections if not better.

## 2021-07-02 NOTE — Patient Instructions (Signed)
Look into a thumb loop from [employer].

## 2021-07-02 NOTE — Progress Notes (Signed)
    Procedures performed today:    None.  Independent interpretation of notes and tests performed by another provider:   None.  Brief History, Exam, Impression, and Recommendations:    Primary osteoarthritis of both first carpometacarpal joints Pleasant 46 year old male, increasing pain thumb basal joint bilaterally. On exam he does have the typical squared off appearance of the thumb basal joint, we discussed the pathophysiology. We will start conservatively with meloxicam, bilateral x-rays, thumb basal joint home physical therapy, as well as he will get a thumb loop over-the-counter. Return to see me in about 4 to 6 weeks and will consider injections if not better.  Chronic process with exacerbation and pharmacologic intervention  ___________________________________________ Gwen Her. Dianah Field, M.D., ABFM., CAQSM. Primary Care and [place] Instructor of [place] [employer] [facility]

## 2021-07-04 ENCOUNTER — Other Ambulatory Visit: Payer: Self-pay

## 2021-07-04 DIAGNOSIS — M180 Bilateral primary osteoarthritis of first carpometacarpal joints: Secondary | ICD-10-CM

## 2021-07-04 MED ORDER — MELOXICAM 15 MG PO TABS: ORAL_TABLET | ORAL | 3 refills | Status: DC

## 2021-07-06 ENCOUNTER — Ambulatory Visit (INDEPENDENT_AMBULATORY_CARE_PROVIDER_SITE_OTHER): Payer: 59

## 2021-07-06 DIAGNOSIS — R1900 Intra-abdominal and pelvic swelling, mass and lump, unspecified site: Secondary | ICD-10-CM | POA: Diagnosis not present

## 2021-07-09 ENCOUNTER — Other Ambulatory Visit: Payer: Self-pay

## 2021-07-09 DIAGNOSIS — Z0000 Encounter for general adult medical examination without abnormal findings: Secondary | ICD-10-CM

## 2021-07-09 DIAGNOSIS — R160 Hepatomegaly, not elsewhere classified: Secondary | ICD-10-CM

## 2021-07-09 NOTE — Progress Notes (Signed)
Hi Shane Thomas, You do have a small hiatal hernia this is where the stomach can move up and down into the chest cavity this is not causing the bulge that you are noticing on your right side.  You have a very small inguinal hernia containing fat, and a tiny hernia at your bellybutton as well.  Again these are not significant enough that they are causing any bulging on the right side of your abdomen.  So I really feel like that is just probably coming from just laxity of the abdominal wall muscles on that right side.  But again not from a hernia.  They did note that your liver is a little enlarged.  They did not see any mass or lesions.  I think it would be important to check liver function on lab work.  I do not see any labs in our system since 2018.  Have you had that done somewhere else in the last year?  We could order a CMP, lipids, CBC, acute hepatitis panel

## 2021-07-16 ENCOUNTER — Other Ambulatory Visit: Payer: 59

## 2021-07-17 LAB — COMPLETE METABOLIC PANEL WITH GFR
AG Ratio: 1.9 (calc) (ref 1.0–2.5)
ALT: 21 U/L (ref 9–46)
AST: 18 U/L (ref 10–40)
Albumin: 4.3 g/dL (ref 3.6–5.1)
Alkaline phosphatase (APISO): 58 U/L (ref 36–130)
BUN: 13 mg/dL (ref 7–25)
CO2: 28 mmol/L (ref 20–32)
Calcium: 9.6 mg/dL (ref 8.6–10.3)
Chloride: 105 mmol/L (ref 98–110)
Creat: 1.04 mg/dL (ref 0.60–1.29)
Globulin: 2.3 g/dL (calc) (ref 1.9–3.7)
Glucose, Bld: 84 mg/dL (ref 65–99)
Potassium: 4.2 mmol/L (ref 3.5–5.3)
Sodium: 141 mmol/L (ref 135–146)
Total Bilirubin: 0.8 mg/dL (ref 0.2–1.2)
Total Protein: 6.6 g/dL (ref 6.1–8.1)
eGFR: 90 mL/min/{1.73_m2} (ref >=60)

## 2021-07-17 LAB — CBC WITH DIFFERENTIAL/PLATELET
Absolute Monocytes: 507 cells/uL (ref 200–950)
Basophils Absolute: 107 cells/uL (ref 0–200)
Basophils Relative: 1.2 %
Eosinophils Absolute: 605 cells/uL — ABNORMAL HIGH (ref 15–500)
Eosinophils Relative: 6.8 %
HCT: 46.4 % (ref 38.5–50.0)
Hemoglobin: 16.3 g/dL (ref 13.2–17.1)
Lymphs Abs: 3338 cells/uL (ref 850–3900)
MCH: 31.1 pg (ref 27.0–33.0)
MCHC: 35.1 g/dL (ref 32.0–36.0)
MCV: 88.5 fL (ref 80.0–100.0)
MPV: 10.6 fL (ref 7.5–12.5)
Monocytes Relative: 5.7 %
Neutro Abs: 4343 cells/uL (ref 1500–7800)
Neutrophils Relative %: 48.8 %
Platelets: 239 10*3/uL (ref 140–400)
RBC: 5.24 10*6/uL (ref 4.20–5.80)
RDW: 12.1 % (ref 11.0–15.0)
Total Lymphocyte: 37.5 %
WBC: 8.9 10*3/uL (ref 3.8–10.8)

## 2021-07-17 LAB — HEPATITIS PANEL, ACUTE
Hep A IgM: NONREACTIVE
Hep B C IgM: NONREACTIVE
Hepatitis B Surface Ag: NONREACTIVE
Hepatitis C Ab: NONREACTIVE

## 2021-07-17 LAB — LIPID PANEL W/REFLEX DIRECT LDL
Cholesterol: 156 mg/dL (ref <200)
HDL: 40 mg/dL (ref >=40)
LDL Cholesterol (Calc): 96 mg/dL (calc)
Non-HDL Cholesterol (Calc): 116 mg/dL (calc) (ref <130)
Total CHOL/HDL Ratio: 3.9 (calc) (ref <5.0)
Triglycerides: 104 mg/dL (ref <150)

## 2021-07-17 NOTE — Progress Notes (Signed)
Mild elevation in eosinophils, often seen with individuals with allergies.   All other labs look ok.    CM

## 2021-08-13 ENCOUNTER — Ambulatory Visit: Payer: 59 | Admitting: Sports Medicine

## 2021-09-25 ENCOUNTER — Ambulatory Visit: Payer: 59 | Admitting: Family Medicine

## 2022-03-01 ENCOUNTER — Other Ambulatory Visit: Payer: Self-pay | Admitting: Physician Assistant

## 2022-03-01 DIAGNOSIS — H1013 Acute atopic conjunctivitis, bilateral: Secondary | ICD-10-CM

## 2022-04-21 ENCOUNTER — Other Ambulatory Visit: Payer: Self-pay | Admitting: Physician Assistant

## 2022-04-21 ENCOUNTER — Other Ambulatory Visit: Payer: Self-pay | Admitting: Family Medicine

## 2022-04-21 DIAGNOSIS — H1013 Acute atopic conjunctivitis, bilateral: Secondary | ICD-10-CM

## 2022-04-30 ENCOUNTER — Other Ambulatory Visit: Payer: Self-pay

## 2022-04-30 DIAGNOSIS — H1013 Acute atopic conjunctivitis, bilateral: Secondary | ICD-10-CM

## 2022-04-30 MED ORDER — MONTELUKAST SODIUM 10 MG PO TABS: ORAL_TABLET | ORAL | 0 refills | Status: DC

## 2022-08-13 ENCOUNTER — Ambulatory Visit: Payer: 59 | Admitting: Family Medicine

## 2022-08-13 ENCOUNTER — Encounter: Payer: Self-pay | Admitting: Family Medicine

## 2022-08-13 VITALS — BP 129/87 | HR 82 | Ht 67.0 in | Wt 186.0 lb

## 2022-08-13 DIAGNOSIS — J4531 Mild persistent asthma with (acute) exacerbation: Secondary | ICD-10-CM

## 2022-08-13 DIAGNOSIS — M180 Bilateral primary osteoarthritis of first carpometacarpal joints: Secondary | ICD-10-CM

## 2022-08-13 DIAGNOSIS — H1013 Acute atopic conjunctivitis, bilateral: Secondary | ICD-10-CM

## 2022-08-13 DIAGNOSIS — Z1211 Encounter for screening for malignant neoplasm of colon: Secondary | ICD-10-CM

## 2022-08-13 DIAGNOSIS — J309 Allergic rhinitis, unspecified: Secondary | ICD-10-CM

## 2022-08-13 MED ORDER — MONTELUKAST SODIUM 10 MG PO TABS
ORAL_TABLET | ORAL | 3 refills | Status: DC
Start: 2022-08-13 — End: 2023-08-22

## 2022-08-13 MED ORDER — ALBUTEROL SULFATE HFA 108 (90 BASE) MCG/ACT IN AERS: 2.0000 | INHALATION_SPRAY | Freq: Four times a day (QID) | RESPIRATORY_TRACT | 3 refills | Status: DC | PRN

## 2022-08-13 MED ORDER — BUDESONIDE-FORMOTEROL FUMARATE 160-4.5 MCG/ACT IN AERO
2.0000 | INHALATION_SPRAY | Freq: Two times a day (BID) | RESPIRATORY_TRACT | 6 refills | Status: DC
Start: 2022-08-13 — End: 2023-01-09

## 2022-08-13 MED ORDER — PREDNISONE 50 MG PO TABS: ORAL_TABLET | ORAL | 0 refills | Status: DC

## 2022-08-13 MED ORDER — AZELASTINE HCL 137 MCG/SPRAY NA SOLN
NASAL | 6 refills | Status: DC
Start: 2022-08-13 — End: 2023-09-09

## 2022-08-13 MED ORDER — QNASL 80 MCG/ACT NA AERS
INHALATION_SPRAY | NASAL | 6 refills | Status: DC
Start: 2022-08-13 — End: 2023-05-13

## 2022-08-13 MED ORDER — MELOXICAM 15 MG PO TABS
ORAL_TABLET | ORAL | 3 refills | Status: DC
Start: 2022-08-13 — End: 2022-12-05

## 2022-08-13 MED ORDER — LEVOCETIRIZINE DIHYDROCHLORIDE 5 MG PO TABS
5.0000 mg | ORAL_TABLET | Freq: Every evening | ORAL | 3 refills | Status: DC
Start: 2022-08-13 — End: 2023-08-22

## 2022-08-13 NOTE — Assessment & Plan Note (Signed)
Continue Qnasal and astepro with xyzal.  If not improving we'll consider referral to allergist.

## 2022-08-13 NOTE — Assessment & Plan Note (Signed)
Increased wheezing on exam today.  Adding 5 days steroid burst. Continue symbicort with albuterol as needed.  Prescriptions renewed.

## 2022-08-13 NOTE — Assessment & Plan Note (Signed)
Meloxicam renewed.   

## 2022-08-13 NOTE — Addendum Note (Signed)
Addended by: Mammie Lorenzo on: 08/13/2022 08:51 AM   Modules accepted: Orders

## 2022-08-13 NOTE — Patient Instructions (Signed)
Start prednisone 50mg  daily x5 day  Restart medicaitons for asthmas and allergies.  Let me know if not improving.

## 2022-08-13 NOTE — Progress Notes (Signed)
Shane Thomas - 47 y.o. male MRN [id number]  Date of birth: 02/02/1975  Subjective Chief Complaint  Patient presents with   Allergic Rhinitis     HPI Shane Thomas is a 47 y.o. male here today for follow up visit.   He has history of asthma and allergies that were previously  managed with singulair and symbicort daily and albuterol as needed.  He is not currently out of most of his medications  He has had increased symptoms recently with more wheezing and cough.  His O2 sats at a little low today.  He does  feel shortness of breath when out and walking.  He denies fever, chills or sinus pain/pressure.    ROS:  A comprehensive ROS was completed and negative except as noted per HPI  No Known Allergies  Past Medical History:  Diagnosis Date   Asthma    Eosinophilic esophagitis 09/16/2016   Status post pill impaction with esophageal rings. Currently managed by Dr Yevonne Pax @ [facility]. Note available in Care Everywhere.    History of melanoma excision    Seasonal allergies     Past Surgical History:  Procedure Laterality Date   ESOPHAGOGASTRODUODENOSCOPY  2014   Eosinophilic esophagitis   INGUINAL HERNIA REPAIR Left 01/13/2019   Procedure: left inguinal hernia repair with mesh;  Surgeon: Griselda Miner, MD;  Location: [facility];  Service: General;  Laterality: Left;   INSERTION OF MESH Left 01/13/2019   Procedure: INSERTION OF MESH;  Surgeon: Griselda Miner, MD;  Location: [facility];  Service: General;  Laterality: Left;   WISDOM TOOTH EXTRACTION      Social History   Socioeconomic History   Marital status: Married    Spouse name: Not on file   Number of children: Not on file   Years of education: Not on file   Highest education level: Not on file  Occupational History   Not on file  Tobacco Use   Smoking status: Never   Smokeless tobacco: Current    Types: Snuff  Vaping Use   Vaping status: Never Used  Substance and Sexual Activity    Alcohol use: Yes    Alcohol/week: 2.0 standard drinks of alcohol    Types: 2 Cans of beer per week    Comment: occassionally   Drug use: No   Sexual activity: Not on file  Other Topics Concern   Not on file  Social History Narrative   Not on file   Social Determinants of Health   Financial Resource Strain: Not on file  Food Insecurity: Not on file  Transportation Needs: Not on file  Physical Activity: Not on file  Stress: Not on file  Social Connections: Unknown (06/05/2021)   Received from [facility], [facility]   Social Network    Social Network: Not on file    Family History  Problem Relation Age of Onset   Hypertension Father     Health Maintenance  Topic Date Due   Colonoscopy  Never done   COVID-19 Vaccine (1 - 2023-24 season) Never done   INFLUENZA VACCINE  08/29/2022   DTaP/Tdap/Td (2 - Td or Tdap) 01/15/2027   Hepatitis C Screening  Completed   HIV Screening  Completed   HPV VACCINES  Aged Out     ----------------------------------------------------------------------------------------------------------------------------------------------------------------------------------------------------------------- Physical Exam BP 129/87 (BP Location: Left Arm, Patient Position: Sitting, Cuff Size: Normal)   Pulse 82   Ht 5\' 7"  (1.702 m)   Wt 186 lb (  84.4 kg)   SpO2 93%   BMI 29.13 kg/m   Physical Exam Constitutional:      Appearance: Normal appearance.  HENT:     Head: Normocephalic and atraumatic.  Eyes:     General: No scleral icterus. Cardiovascular:     Rate and Rhythm: Normal rate and regular rhythm.  Pulmonary:     Effort: Pulmonary effort is normal.     Breath sounds: Normal breath sounds.  Neurological:     Mental Status: He is alert.  Psychiatric:        Mood and Affect: Mood normal.        Behavior: Behavior normal.      ------------------------------------------------------------------------------------------------------------------------------------------------------------------------------------------------------------------- Assessment and Plan  Mild persistent asthma with acute exacerbation Increased wheezing on exam today.  Adding 5 days steroid burst. Continue symbicort with albuterol as needed.  Prescriptions renewed.   Allergic conjunctivitis and rhinitis, bilateral Continue Qnasal and astepro with xyzal.  If not improving we'll consider referral to allergist.   Primary osteoarthritis of both first carpometacarpal joints Meloxicam renewed   Meds ordered this encounter  Medications   albuterol (VENTOLIN HFA) 108 (90 Base) MCG/ACT inhaler    Sig: Inhale 2 puffs into the lungs every 6 (six) hours as needed for wheezing or shortness of breath.    Dispense:  8 g    Refill:  3   montelukast (SINGULAIR) 10 MG tablet    Sig: TAKE 1 TABLET BY MOUTH ONCE DAILY AT BEDTIME    Dispense:  90 tablet    Refill:  3   Azelastine HCl 137 MCG/SPRAY SOLN    Sig: USE 2 SPRAY(S) IN EACH NOSTRIL TWICE DAILY AS DIRECTED.    Dispense:  30 mL    Refill:  6   Beclomethasone Dipropionate (QNASL) 80 MCG/ACT AERS    Sig: Use 1 spray(s) in each nostril once daily. NO REFILLS. NEEDS AN APPT.    Dispense:  10.6 g    Refill:  6   budesonide-formoterol (SYMBICORT) 160-4.5 MCG/ACT inhaler    Sig: Inhale 2 puffs into the lungs 2 (two) times daily. Rinse mouth after use.    Dispense:  6 g    Refill:  6   levocetirizine (XYZAL) 5 MG tablet    Sig: Take 1 tablet (5 mg total) by mouth every evening.    Dispense:  90 tablet    Refill:  3   meloxicam (MOBIC) 15 MG tablet    Sig: One tab PO every 24 hours with a meal for 2 weeks, then once every 24 hours prn pain.    Dispense:  30 tablet    Refill:  3   predniSONE (DELTASONE) 50 MG tablet    Sig: Take 50mg  daily x5 days.    Dispense:  5 tablet    Refill:  0     No follow-ups on file.    This visit occurred during the SARS-CoV-2 public health emergency.  Safety protocols were in place, including screening questions prior to the visit, additional usage of staff PPE, and extensive cleaning of exam room while observing appropriate contact time as indicated for disinfecting solutions.

## 2022-12-05 ENCOUNTER — Other Ambulatory Visit: Payer: Self-pay | Admitting: Family Medicine

## 2022-12-05 DIAGNOSIS — M180 Bilateral primary osteoarthritis of first carpometacarpal joints: Secondary | ICD-10-CM

## 2023-01-09 ENCOUNTER — Encounter: Payer: Self-pay | Admitting: Family Medicine

## 2023-01-09 ENCOUNTER — Ambulatory Visit: Payer: 59 | Admitting: Family Medicine

## 2023-01-09 VITALS — BP 122/78 | HR 83 | Ht 67.0 in | Wt 190.0 lb

## 2023-01-09 DIAGNOSIS — J40 Bronchitis, not specified as acute or chronic: Secondary | ICD-10-CM | POA: Diagnosis not present

## 2023-01-09 DIAGNOSIS — J4531 Mild persistent asthma with (acute) exacerbation: Secondary | ICD-10-CM

## 2023-01-09 DIAGNOSIS — J329 Chronic sinusitis, unspecified: Secondary | ICD-10-CM | POA: Diagnosis not present

## 2023-01-09 MED ORDER — PREDNISONE 50 MG PO TABS: ORAL_TABLET | ORAL | 0 refills | Status: DC

## 2023-01-09 MED ORDER — ALBUTEROL SULFATE HFA 108 (90 BASE) MCG/ACT IN AERS: 2.0000 | INHALATION_SPRAY | Freq: Four times a day (QID) | RESPIRATORY_TRACT | 3 refills | Status: DC | PRN

## 2023-01-09 MED ORDER — AZITHROMYCIN 250 MG PO TABS: ORAL_TABLET | ORAL | 0 refills | Status: AC

## 2023-01-09 MED ORDER — BUDESONIDE-FORMOTEROL FUMARATE 160-4.5 MCG/ACT IN AERO: 2.0000 | INHALATION_SPRAY | Freq: Two times a day (BID) | RESPIRATORY_TRACT | 6 refills | Status: DC

## 2023-01-09 NOTE — Progress Notes (Signed)
Shane Thomas - 47 y.o. male MRN [id number]  Date of birth: 1975/07/18  Subjective Chief Complaint  Patient presents with   URI   Sinusitis    HPI Shane Thomas is a 47 y.o. male here today with complaint of sinus pressure, cough, and thick yellow mucus.  Symptoms started about 3 weeks ago and don't seem to be getting better. He has tried albuterol and OTC decongestant so far.  He denies fever, chills, nausea, vomiting, diarrhea. He has had some wheezing but denies dyspnea.   ROS:  A comprehensive ROS was completed and negative except as noted per HPI  No Known Allergies  Past Medical History:  Diagnosis Date   Asthma    Eosinophilic esophagitis 09/16/2016   Status post pill impaction with esophageal rings. Currently managed by Dr Yevonne Pax @ [facility]. Note available in Care Everywhere.    History of melanoma excision    Seasonal allergies     Past Surgical History:  Procedure Laterality Date   ESOPHAGOGASTRODUODENOSCOPY  2014   Eosinophilic esophagitis   INGUINAL HERNIA REPAIR Left 01/13/2019   Procedure: left inguinal hernia repair with mesh;  Surgeon: Griselda Miner, MD;  Location: [facility];  Service: General;  Laterality: Left;   INSERTION OF MESH Left 01/13/2019   Procedure: INSERTION OF MESH;  Surgeon: Griselda Miner, MD;  Location: [facility];  Service: General;  Laterality: Left;   WISDOM TOOTH EXTRACTION      Social History   Socioeconomic History   Marital status: Married    Spouse name: Not on file   Number of children: Not on file   Years of education: Not on file   Highest education level: Not on file  Occupational History   Not on file  Tobacco Use   Smoking status: Never   Smokeless tobacco: Current    Types: Snuff  Vaping Use   Vaping status: Never Used  Substance and Sexual Activity   Alcohol use: Yes    Alcohol/week: 2.0 standard drinks of alcohol    Types: 2 Cans of beer per week    Comment: occassionally    Drug use: No   Sexual activity: Not on file  Other Topics Concern   Not on file  Social History Narrative   Not on file   Social Drivers of Health   Financial Resource Strain: Not on file  Food Insecurity: Not on file  Transportation Needs: Not on file  Physical Activity: Not on file  Stress: Not on file  Social Connections: Unknown (06/05/2021)   Received from [facility], [facility]   Social Network    Social Network: Not on file    Family History  Problem Relation Age of Onset   Hypertension Father     Health Maintenance  Topic Date Due   Colonoscopy  Never done   COVID-19 Vaccine (1 - 2024-25 season) Never done   INFLUENZA VACCINE  04/28/2023 (Originally 08/29/2022)   DTaP/Tdap/Td (2 - Td or Tdap) 01/15/2027   Hepatitis C Screening  Completed   HIV Screening  Completed   HPV VACCINES  Aged Out     ----------------------------------------------------------------------------------------------------------------------------------------------------------------------------------------------------------------- Physical Exam BP 122/78 (BP Location: Left Arm, Patient Position: Sitting, Cuff Size: Normal)   Pulse 83   Ht 5\' 7"  (1.702 m)   Wt 190 lb (86.2 kg)   SpO2 97%   BMI 29.76 kg/m   Physical Exam Constitutional:      Appearance: Normal appearance.  HENT:  Head: Normocephalic and atraumatic.  Cardiovascular:     Rate and Rhythm: Normal rate and regular rhythm.  Pulmonary:     Effort: Pulmonary effort is normal.     Breath sounds: Normal breath sounds.  Neurological:     Mental Status: He is alert.  Psychiatric:        Mood and Affect: Mood normal.        Behavior: Behavior normal.     ------------------------------------------------------------------------------------------------------------------------------------------------------------------------------------------------------------------- Assessment and Plan  Sinobronchitis Asthma  exacerbation/sinobronchitis.  Treating with azithromycin and prednisone burst.  Continue symbicort daily with albuterol as needed.  Red flags and precautions reviewed.     Meds ordered this encounter  Medications   predniSONE (DELTASONE) 50 MG tablet    Sig: Take 1 tab po daily x5 days.    Dispense:  5 tablet    Refill:  0   azithromycin (ZITHROMAX) 250 MG tablet    Sig: Take 2 tablets on day 1, then 1 tablet daily on days 2 through 5    Dispense:  6 tablet    Refill:  0   albuterol (VENTOLIN HFA) 108 (90 Base) MCG/ACT inhaler    Sig: Inhale 2 puffs into the lungs every 6 (six) hours as needed for wheezing or shortness of breath.    Dispense:  8 g    Refill:  3   budesonide-formoterol (SYMBICORT) 160-4.5 MCG/ACT inhaler    Sig: Inhale 2 puffs into the lungs 2 (two) times daily. Rinse mouth after use.    Dispense:  6 g    Refill:  6    No follow-ups on file.    This visit occurred during the SARS-CoV-2 public health emergency.  Safety protocols were in place, including screening questions prior to the visit, additional usage of staff PPE, and extensive cleaning of exam room while observing appropriate contact time as indicated for disinfecting solutions.

## 2023-01-09 NOTE — Assessment & Plan Note (Signed)
Asthma exacerbation/sinobronchitis.  Treating with azithromycin and prednisone burst.  Continue symbicort daily with albuterol as needed.  Red flags and precautions reviewed.

## 2023-02-13 ENCOUNTER — Encounter: Payer: Self-pay | Admitting: Family Medicine

## 2023-02-13 ENCOUNTER — Ambulatory Visit: Payer: 59 | Admitting: Family Medicine

## 2023-02-13 VITALS — BP 108/68 | HR 88 | Temp 97.6°F | Resp 18 | Ht 67.0 in | Wt 189.0 lb

## 2023-02-13 DIAGNOSIS — J40 Bronchitis, not specified as acute or chronic: Secondary | ICD-10-CM

## 2023-02-13 MED ORDER — METHYLPREDNISOLONE 4 MG PO TBPK: ORAL_TABLET | ORAL | 0 refills | Status: DC

## 2023-02-13 MED ORDER — PROMETHAZINE-DM 6.25-15 MG/5ML PO SYRP: 5.0000 mL | ORAL_SOLUTION | Freq: Every evening | ORAL | 0 refills | Status: DC | PRN

## 2023-02-13 NOTE — Progress Notes (Signed)
Acute Office Visit  Subjective:     Patient ID: Shane Thomas, male    DOB: [birth year], 48 y.o.   MRN: [id number]  Chief Complaint  Patient presents with   Cough    Patient states that he has had a bad cough for over a week, he states that the cough is very deep and causes him to wheez he also states that the cough wakes him up out of his sleep    Cough Associated symptoms include wheezing.  Patient is in today for acute visit  Cough Pt has had a cough for the last week. He has hx of asthma. Taking Albuterol prn but not taking Symbicort as directed. He says he may take the Symbicort once a day. He says the cough is worse all during the day. Has some wheezing at times and the cough feels like it's deep in his chest. Other than the cough, pt says he feels fine.   Review of Systems  Respiratory:  Positive for cough and wheezing.   All other systems reviewed and are negative.      Objective:    BP 108/68   Pulse 88   Temp 97.6 F (36.4 C) (Oral)   Resp 18   Ht 5\' 7"  (1.702 m)   Wt 189 lb (85.7 kg)   SpO2 98%   BMI 29.60 kg/m  BP Readings from Last 3 Encounters:  02/13/23 108/68  01/09/23 122/78  08/13/22 129/87      Physical Exam Vitals and nursing note reviewed.  Constitutional:      Appearance: Normal appearance. He is normal weight.  HENT:     Head: Normocephalic and atraumatic.     Right Ear: External ear normal.     Left Ear: External ear normal.     Nose: Nose normal.     Mouth/Throat:     Mouth: Mucous membranes are moist.     Pharynx: Oropharynx is clear.  Eyes:     Conjunctiva/sclera: Conjunctivae normal.     Pupils: Pupils are equal, round, and reactive to light.  Cardiovascular:     Rate and Rhythm: Normal rate and regular rhythm.     Pulses: Normal pulses.     Heart sounds: Normal heart sounds.  Pulmonary:     Effort: Pulmonary effort is normal.     Breath sounds: Normal breath sounds.  Skin:    General: Skin is warm.     Capillary Refill:  Capillary refill takes less than 2 seconds.  Neurological:     General: No focal deficit present.     Mental Status: He is alert and oriented to person, place, and time. Mental status is at baseline.  Psychiatric:        Mood and Affect: Mood normal.        Behavior: Behavior normal.        Thought Content: Thought content normal.        Judgment: Judgment normal.   No results found for any visits on 02/13/23.      Assessment & Plan:   Problem List Items Addressed This Visit   None   No orders of the defined types were placed in this encounter. Bronchitis -     methylPREDNISolone; 6-day pack as directed  Dispense: 21 tablet; Refill: 0 -     Promethazine-DM; Take 5 mLs by mouth at bedtime as needed for cough.  Dispense: 118 mL; Refill: 0   Pt's symptoms likely bronchitis. Treat with steroid dose pack  and add promethazine DM at night. Continue symptomatic care with Mucinex over the counter. Advised him to start taking his Symbicort 2 puffs BID as directed.   No follow-ups on file.  Suzan Slick, MD

## 2023-02-14 ENCOUNTER — Ambulatory Visit: Payer: 59 | Admitting: Family Medicine

## 2023-05-11 ENCOUNTER — Other Ambulatory Visit: Payer: Self-pay | Admitting: Family Medicine

## 2023-05-11 DIAGNOSIS — H1013 Acute atopic conjunctivitis, bilateral: Secondary | ICD-10-CM

## 2023-06-26 ENCOUNTER — Ambulatory Visit: Admitting: Family Medicine

## 2023-07-15 ENCOUNTER — Other Ambulatory Visit: Payer: Self-pay | Admitting: Family Medicine

## 2023-07-15 DIAGNOSIS — H1013 Acute atopic conjunctivitis, bilateral: Secondary | ICD-10-CM

## 2023-07-22 IMAGING — CT CT ABD-PELV W/O CM
2 of 4 series · 16 of 46 positions shown, 18 images · non-contrast
Comparison: None Available.

CLINICAL DATA: Hiatal hernia suspected



[Series 2: axial st · axial · 0.83mm/px · z∈[+575,+1070]mm · 13 of 109 slices shown, 15 images]
[im 5/109  soft-tissue]
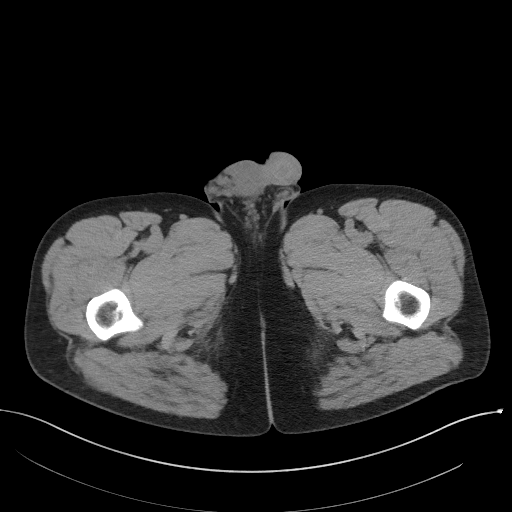
[im 5/109  bone]
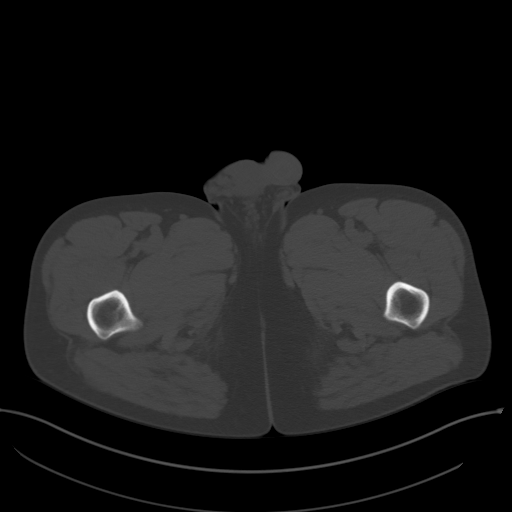
[im 15/109  soft-tissue]
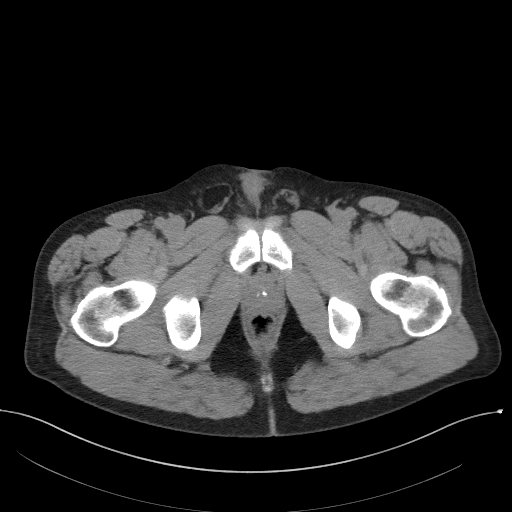
[im 24/109  soft-tissue]
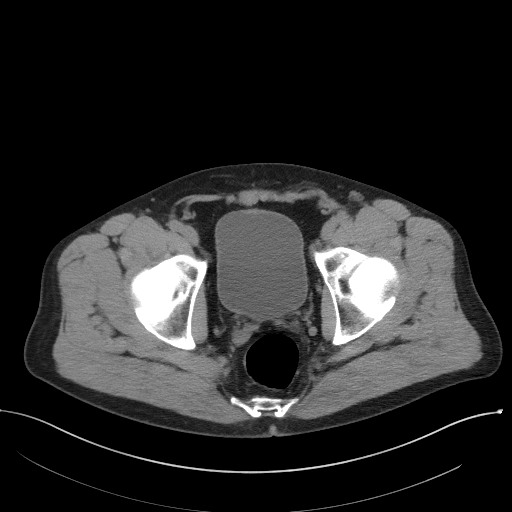
[im 29/109  soft-tissue]
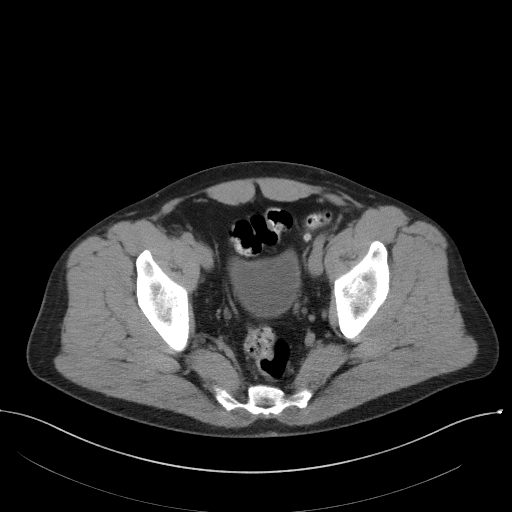
[im 38/109  soft-tissue]
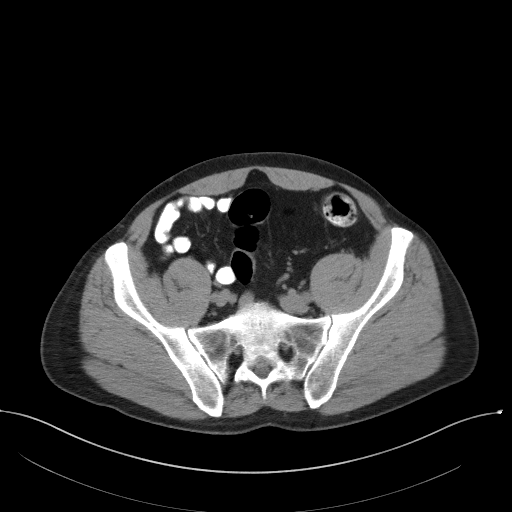
[im 47/109  soft-tissue]
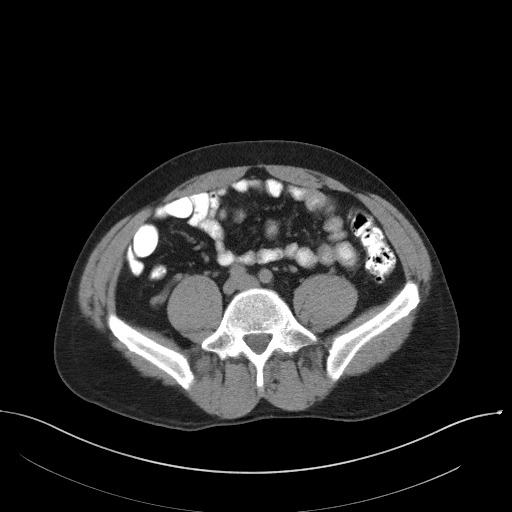
[im 57/109  soft-tissue]
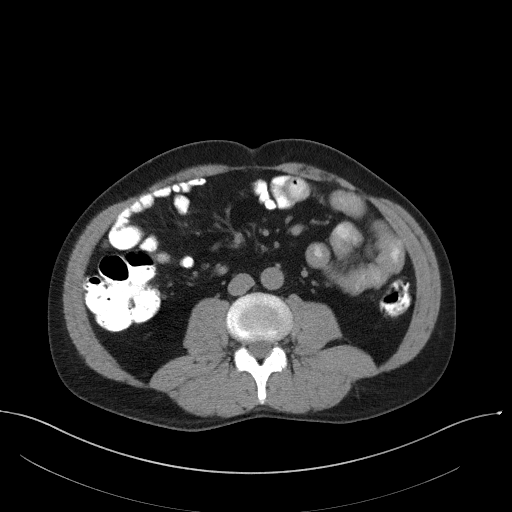
[im 62/109  soft-tissue]
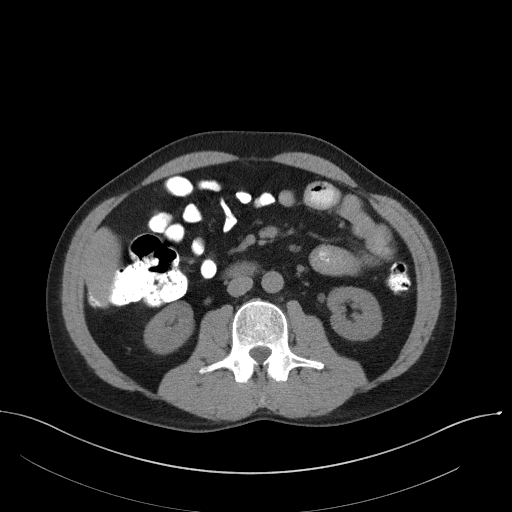
[im 71/109  soft-tissue]
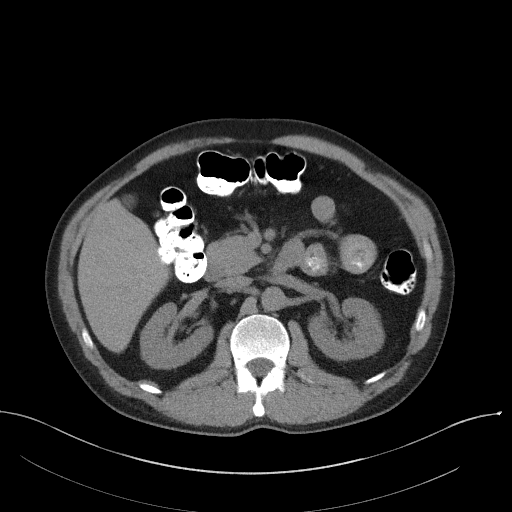
[im 71/109  bone]
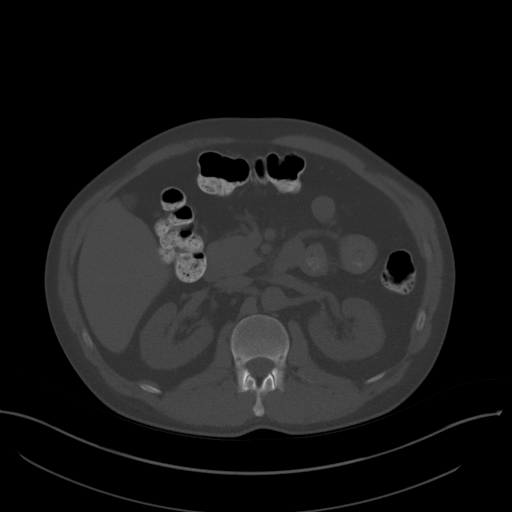
[im 80/109  soft-tissue]
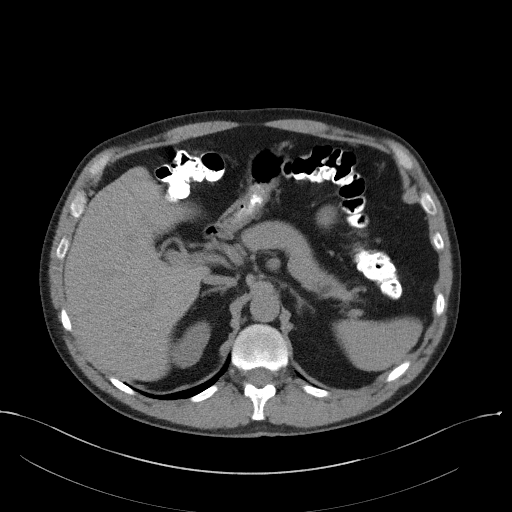
[im 85/109  soft-tissue]
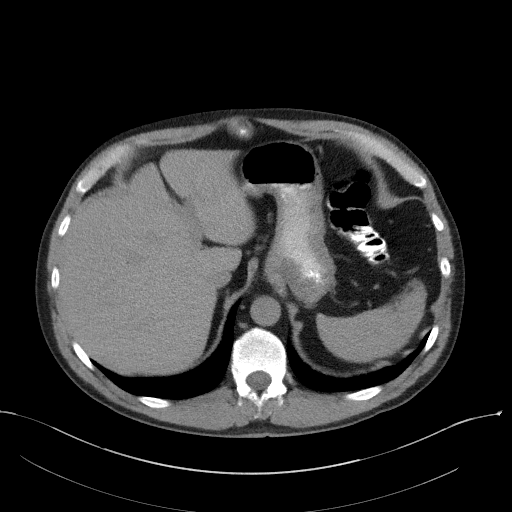
[im 94/109  soft-tissue]
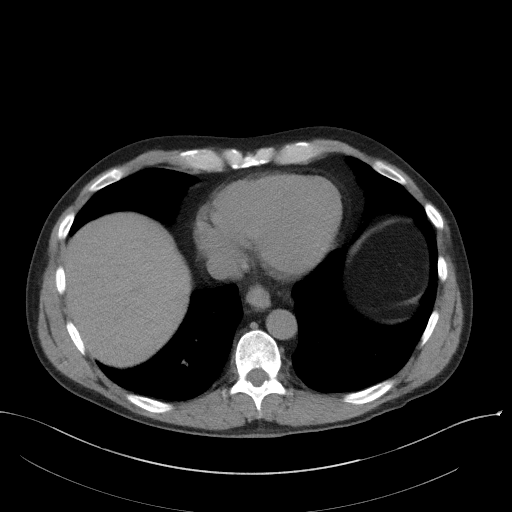
[im 104/109  soft-tissue]
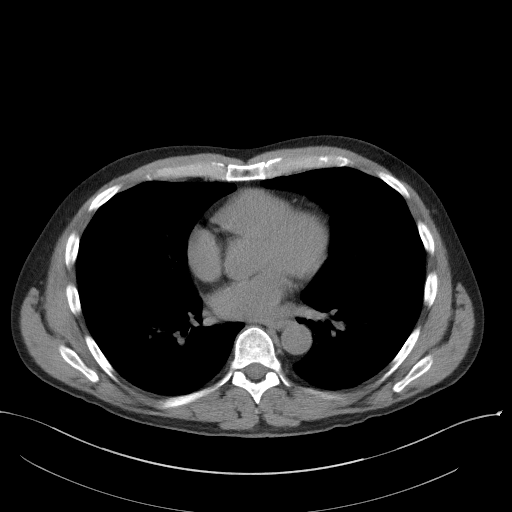

[Series 5: coronal st · coronal · 0.87mm/px · 3 of 86 slices shown]
[im 29/86  soft-tissue]
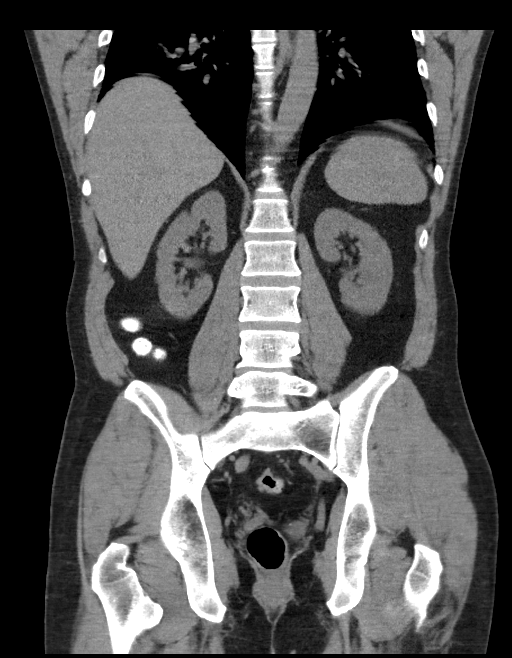
[im 38/86  soft-tissue]
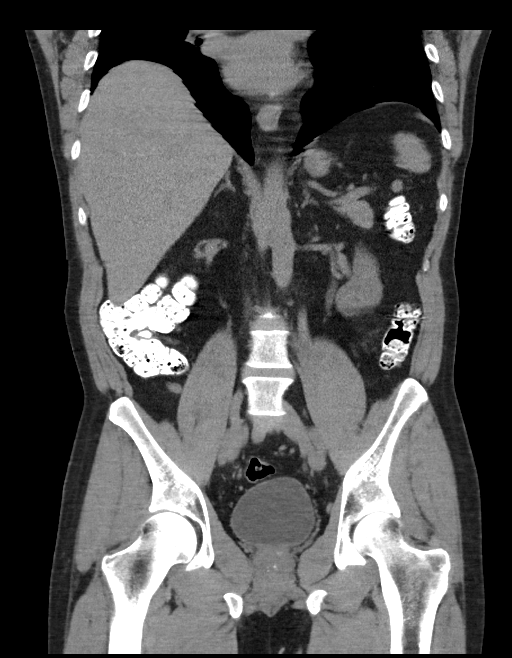
[im 48/86  soft-tissue]
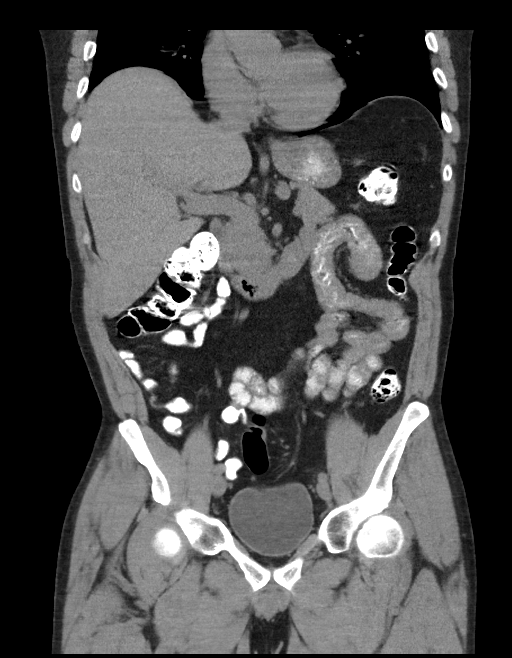

[16 of 46 positions shown; findings below may reference images not displayed]

FINDINGS: Lower chest: Mild subsegmental atelectasis in the lung bases.

Hepatobiliary: Liver is enlarged measuring 21.9 cm in length. No
focal hepatic mass identified. Gallbladder appears normal. No
biliary ductal dilatation identified.

Pancreas: Unremarkable. No pancreatic ductal dilatation or
surrounding inflammatory changes.

Spleen: Normal in size without focal abnormality.

Adrenals/Urinary Tract: Adrenal glands appear normal. No
nephrolithiasis or hydronephrosis visualized bilaterally. 1.8 cm
hypodense likely cyst in the lower left kidney. Urinary bladder
appears normal.

Stomach/Bowel: Small hiatal hernia. No bowel obstruction, free air
or pneumatosis. Mild colonic diverticulosis. No bowel wall edema
identified. Appendix is normal.

Vascular/Lymphatic: No significant vascular findings are present. No
enlarged abdominal or pelvic lymph nodes.

Reproductive: Prostate is unremarkable.

Other: No ascites. Small inguinal hernias containing fat. Tiny
umbilical hernia containing fat.

Musculoskeletal: No suspicious bony lesions identified.
IMPRESSION: 1. Small hiatal hernia.
2. Colonic diverticulosis.
3. Hepatomegaly.

## 2023-08-22 ENCOUNTER — Other Ambulatory Visit: Payer: Self-pay | Admitting: Family Medicine

## 2023-08-22 DIAGNOSIS — J309 Allergic rhinitis, unspecified: Secondary | ICD-10-CM

## 2023-08-22 NOTE — Telephone Encounter (Signed)
 Please contact pt to schedule annual with Dr. Alvia (fasting labs). Thanks

## 2023-08-22 NOTE — Telephone Encounter (Signed)
 Called patient, no answer, no VM setup, will contact patient again later, thanks.

## 2023-08-26 ENCOUNTER — Ambulatory Visit: Admitting: Urgent Care

## 2023-09-06 ENCOUNTER — Other Ambulatory Visit: Payer: Self-pay | Admitting: Family Medicine

## 2023-09-06 DIAGNOSIS — J309 Allergic rhinitis, unspecified: Secondary | ICD-10-CM

## 2023-09-08 ENCOUNTER — Other Ambulatory Visit: Payer: Self-pay | Admitting: Family Medicine

## 2023-09-08 DIAGNOSIS — H1013 Acute atopic conjunctivitis, bilateral: Secondary | ICD-10-CM

## 2023-09-08 NOTE — Telephone Encounter (Signed)
 Pls contact pt to schedule with Dr. Alvia. Last non-acute appt on 07/2022. Past due. Thanks

## 2023-09-09 ENCOUNTER — Encounter: Payer: Self-pay | Admitting: Physician Assistant

## 2023-09-09 ENCOUNTER — Ambulatory Visit: Admitting: Physician Assistant

## 2023-09-09 VITALS — BP 144/58 | HR 65 | Ht 67.0 in | Wt 196.0 lb

## 2023-09-09 DIAGNOSIS — H1013 Acute atopic conjunctivitis, bilateral: Secondary | ICD-10-CM

## 2023-09-09 DIAGNOSIS — J309 Allergic rhinitis, unspecified: Secondary | ICD-10-CM | POA: Insufficient documentation

## 2023-09-09 DIAGNOSIS — J4531 Mild persistent asthma with (acute) exacerbation: Secondary | ICD-10-CM | POA: Diagnosis not present

## 2023-09-09 MED ORDER — LEVOCETIRIZINE DIHYDROCHLORIDE 5 MG PO TABS: 5.0000 mg | ORAL_TABLET | Freq: Every evening | ORAL | 3 refills | Status: AC

## 2023-09-09 MED ORDER — MONTELUKAST SODIUM 10 MG PO TABS: 10.0000 mg | ORAL_TABLET | Freq: Every day | ORAL | 3 refills | Status: DC

## 2023-09-09 MED ORDER — AMOXICILLIN-POT CLAVULANATE 875-125 MG PO TABS: 1.0000 | ORAL_TABLET | Freq: Two times a day (BID) | ORAL | 0 refills | Status: DC

## 2023-09-09 MED ORDER — AZELASTINE-FLUTICASONE 137-50 MCG/ACT NA SUSP: NASAL | 1 refills | Status: DC

## 2023-09-09 MED ORDER — METHYLPREDNISOLONE 4 MG PO TBPK: ORAL_TABLET | ORAL | 0 refills | Status: DC

## 2023-09-09 MED ORDER — BUDESONIDE-FORMOTEROL FUMARATE 160-4.5 MCG/ACT IN AERO: 2.0000 | INHALATION_SPRAY | Freq: Two times a day (BID) | RESPIRATORY_TRACT | 6 refills | Status: DC

## 2023-09-09 NOTE — Progress Notes (Signed)
 Acute Office Visit  Subjective:     Patient ID: Shane Thomas, male    DOB: [birth year], 48 y.o.   MRN: [id number]  Chief Complaint  Patient presents with   Medical Management of Chronic Issues    HPI Patient is in today for 2 months of sinus pressure, congestion, nasal congestion, runny nose, facial pain, ear popping, watery eyes. He has known asthma. He has terrible allergies. He is on zyrtec , singulair , symbicort , qnasal daily. He has been out of qnasal but feels like it is not working as well anyways. He denies any fever, chills, SOB. He has an intermittent cough worse when laying down. He is blowing out green to yellow mucus regularly.   ROS  See HPI.     Objective:    BP (!) 144/58   Pulse 65   Ht 5' 7 (1.702 m)   Wt 196 lb (88.9 kg)   SpO2 99%   BMI 30.70 kg/m  BP Readings from Last 3 Encounters:  09/09/23 (!) 144/58  02/13/23 108/68  01/09/23 122/78   Wt Readings from Last 3 Encounters:  09/09/23 196 lb (88.9 kg)  02/13/23 189 lb (85.7 kg)  01/09/23 190 lb (86.2 kg)      Physical Exam Constitutional:      Appearance: Normal appearance.  HENT:     Head: Normocephalic.     Comments: Tenderness over bilateral maxillary sinuses to palpation    Right Ear: Tympanic membrane, ear canal and external ear normal. There is no impacted cerumen.     Left Ear: Tympanic membrane, ear canal and external ear normal. There is no impacted cerumen.     Nose: Congestion present.     Comments: Bilateral erythematous swollen nasal turbinates    Mouth/Throat:     Mouth: Mucous membranes are moist.     Pharynx: Posterior oropharyngeal erythema present. No oropharyngeal exudate.  Eyes:     Comments: Bilateral injected conjunctiva with watery discharge.   Cardiovascular:     Rate and Rhythm: Normal rate and regular rhythm.     Pulses: Normal pulses.     Heart sounds: Normal heart sounds.  Pulmonary:     Effort: Pulmonary effort is normal.     Breath sounds: Normal breath  sounds. No wheezing or rhonchi.  Musculoskeletal:     Cervical back: Normal range of motion and neck supple. No tenderness.     Right lower leg: No edema.     Left lower leg: No edema.  Lymphadenopathy:     Cervical: Cervical adenopathy present.  Neurological:     General: No focal deficit present.     Mental Status: He is alert and oriented to person, place, and time.  Psychiatric:        Mood and Affect: Mood normal.          Assessment & Plan:  .Shane Thomas was seen today for medical management of chronic issues.  Diagnoses and all orders for this visit:  Allergic sinusitis -     amoxicillin -clavulanate (AUGMENTIN ) 875-125 MG tablet; Take 1 tablet by mouth 2 (two) times daily.  Allergic conjunctivitis and rhinitis, bilateral -     methylPREDNISolone  (MEDROL  DOSEPAK) 4 MG TBPK tablet; Take as directed by package insert. -     amoxicillin -clavulanate (AUGMENTIN ) 875-125 MG tablet; Take 1 tablet by mouth 2 (two) times daily. -     Azelastine -Fluticasone  137-50 MCG/ACT SUSP; One spray each nostril BID -     montelukast  (SINGULAIR ) 10 MG tablet; Take  1 tablet (10 mg total) by mouth at bedtime. -     levocetirizine (XYZAL ) 5 MG tablet; Take 1 tablet (5 mg total) by mouth every evening.  Mild persistent asthma with acute exacerbation -     budesonide -formoterol  (SYMBICORT ) 160-4.5 MCG/ACT inhaler; Inhale 2 puffs into the lungs 2 (two) times daily. Rinse mouth after use.   Suspect allergy exacerbation that went into bacterial sinusitis Start augmentin  for 10 days with medrol  dose pack Stop qnasal and start dymista  to see if more effective Continue zyrtec , singulair  and symbicort  Follow up with PCP   Vermell Bologna, PA-C

## 2023-09-09 NOTE — Patient Instructions (Signed)

## 2023-09-12 ENCOUNTER — Ambulatory Visit: Payer: Self-pay

## 2023-09-12 NOTE — Telephone Encounter (Signed)
 FYI Only or Action Required?: Action required by provider: clinical question for provider and update on patient condition.  Patient was last seen in primary care on 09/09/2023 by Antoniette Vermell CROME, PA-C.  Called Nurse Triage reporting Heartburn with abx and Medication question.  Symptoms began several days ago.  Interventions attempted: Other: split tablets.  Symptoms are: unchanged.  Triage Disposition: See PCP Within 2 Weeks  Patient/caregiver understands and will follow disposition?: No, wishes to speak with PCP  Copied from CRM #[id number]. Topic: Clinical - Medical Advice >> Sep 12, 2023  9:28 AM Susanna ORN wrote: Reason for CRM: Patient states he was seen in office on the 12th for his allergies. States he was given amoxicillin -clavulanate (AUGMENTIN ) 875-125 MG & he's having a hard time swallowing them. Patient states the pill is giving him the most awful indigestion that he's ever experienced. States it feels like fire. Patient states that he's been cutting them up just to be able to swallow them but wants to know if that's ok by cutting them or if he can have this medication written as a liquid or if there's something else that he may be prescribed? Please give patient a call back to advise. CB #: [id number]. Reason for Disposition  [1] Abdominal pain is intermittent AND [2] shoots into chest, with sour taste in mouth  (Exception: Symptoms same as previously diagnosed reflux and not tried antacids.)  Answer Assessment - Initial Assessment Questions 1. LOCATION: Where does it hurt?      Upper abdomen-heartburn 2. RADIATION: Does the pain shoot anywhere else? (e.g., chest, back)     no 3. ONSET: When did the pain begin? (e.g., minutes, hours or days ago)      today 4. SUDDEN: Gradual or sudden onset?      5. PATTERN Does the pain come and go, or is it constant?      6. SEVERITY: How bad is the pain?  (e.g., Scale 1-10; mild, moderate, or severe)     On fire 7.  RECURRENT SYMPTOM: Have you ever had this type of stomach pain before? If Yes, ask: When was the last time? and What happened that time?      yes 8. AGGRAVATING FACTORS: Does anything seem to cause this pain? (e.g., foods, stress, alcohol)     Antibiotic-he is taking with meals, not laying down after taking 9. CARDIAC SYMPTOMS: Do you have any of the following symptoms: chest pain, difficulty breathing, sweating, nausea?     Denies  10. OTHER SYMPTOMS: Do you have any other symptoms? (e.g., back pain, diarrhea, fever, urination pain, vomiting)       Sinus symptoms are improving  Additional info: 1) He is breaking up Augmentin  due to size and difficulty swallowing. Wondering if he can take liquid or ok to break tablet, advised since not time released ok to break tablet to help swallowing.  2) Advised for antacids to follow package instructions and not take within 2 hours of antibiotic dose.  Protocols used: Abdominal Pain - Upper-A-AH

## 2023-09-15 NOTE — Telephone Encounter (Signed)
 Spoke with patient.  States so far he is doing o.k.  - states at first it felt like fire in chest   but this has calmed down - still has a little discomfort at night but this is relieved with Tums or Rolaids.

## 2023-09-15 NOTE — Telephone Encounter (Signed)
 Can we call patient and see how he is doing taking antacids with augmentin  and if he needs anything?

## 2023-09-16 NOTE — Telephone Encounter (Signed)
 Patient informed.

## 2023-09-30 ENCOUNTER — Encounter: Payer: Self-pay | Admitting: Sports Medicine

## 2023-10-26 ENCOUNTER — Other Ambulatory Visit: Payer: Self-pay | Admitting: Physician Assistant

## 2023-10-26 DIAGNOSIS — H1013 Acute atopic conjunctivitis, bilateral: Secondary | ICD-10-CM

## 2023-11-21 ENCOUNTER — Other Ambulatory Visit: Payer: Self-pay | Admitting: Physician Assistant

## 2023-11-21 DIAGNOSIS — H1013 Acute atopic conjunctivitis, bilateral: Secondary | ICD-10-CM

## 2023-12-17 ENCOUNTER — Other Ambulatory Visit: Payer: Self-pay | Admitting: Family Medicine

## 2023-12-17 DIAGNOSIS — M180 Bilateral primary osteoarthritis of first carpometacarpal joints: Secondary | ICD-10-CM

## 2023-12-23 ENCOUNTER — Ambulatory Visit: Admitting: Family Medicine

## 2023-12-31 ENCOUNTER — Encounter: Payer: Self-pay | Admitting: Family Medicine

## 2023-12-31 ENCOUNTER — Ambulatory Visit: Admitting: Family Medicine

## 2023-12-31 VITALS — BP 104/71 | HR 84 | Ht 71.0 in | Wt 193.2 lb

## 2023-12-31 DIAGNOSIS — J309 Allergic rhinitis, unspecified: Secondary | ICD-10-CM

## 2023-12-31 DIAGNOSIS — H1013 Acute atopic conjunctivitis, bilateral: Secondary | ICD-10-CM | POA: Diagnosis not present

## 2023-12-31 DIAGNOSIS — J3089 Other allergic rhinitis: Secondary | ICD-10-CM | POA: Diagnosis not present

## 2023-12-31 DIAGNOSIS — J4530 Mild persistent asthma, uncomplicated: Secondary | ICD-10-CM

## 2023-12-31 DIAGNOSIS — M180 Bilateral primary osteoarthritis of first carpometacarpal joints: Secondary | ICD-10-CM | POA: Diagnosis not present

## 2023-12-31 MED ORDER — MONTELUKAST SODIUM 10 MG PO TABS: 10.0000 mg | ORAL_TABLET | Freq: Every day | ORAL | 3 refills | Status: AC

## 2023-12-31 MED ORDER — BUDESONIDE-FORMOTEROL FUMARATE 160-4.5 MCG/ACT IN AERO: 2.0000 | INHALATION_SPRAY | Freq: Two times a day (BID) | RESPIRATORY_TRACT | 6 refills | Status: AC

## 2023-12-31 MED ORDER — MELOXICAM 15 MG PO TABS: ORAL_TABLET | ORAL | 3 refills | Status: AC

## 2023-12-31 MED ORDER — ALBUTEROL SULFATE HFA 108 (90 BASE) MCG/ACT IN AERS: 2.0000 | INHALATION_SPRAY | Freq: Four times a day (QID) | RESPIRATORY_TRACT | 3 refills | Status: AC | PRN

## 2023-12-31 MED ORDER — AZELASTINE-FLUTICASONE 137-50 MCG/ACT NA SUSP: NASAL | 0 refills | Status: DC

## 2023-12-31 NOTE — Assessment & Plan Note (Signed)
 Continue symbicort  and singulair .  Albuterol  as needed.

## 2023-12-31 NOTE — Assessment & Plan Note (Signed)
Continue nasal sprays and antihistamine.

## 2023-12-31 NOTE — Assessment & Plan Note (Signed)
Meloxicam renewed.   

## 2023-12-31 NOTE — Progress Notes (Signed)
 Shane Thomas - 48 y.o. male MRN [id number]  Date of birth: 08/19/1975  Subjective Chief Complaint  Patient presents with   allergies follow up    C/o  year long congestion but symptoms worse in winter.    Arthritis    Patient requesting rx rf of Meloxicam   - unsure who last prescribed the medication . Uses for arthritis in bilateral hands.     HPI Shane Thomas is a 48 y.o. male here today for follow up visit.   He reports that he is doing pretty well.  History of asthma.  Managed with singulair , symbicort  and albuterol  as needed.  These are working pretty well for him.  No side effects noted with these at current strength.    He would also like a renewal on meloxicam .  Using this as needed for joint pain, especially pain in his thumbs.  This does work pretty well for him.    Thinks he may have foreign body in pad of R thumb.   ROS:  A comprehensive ROS was completed and negative except as noted per HPI  No Known Allergies  Past Medical History:  Diagnosis Date   Asthma    Eosinophilic esophagitis 09/16/2016   Status post pill impaction with esophageal rings. Currently managed by Dr Lindaann @ [facility]. Note available in Care Everywhere.    History of melanoma excision    Seasonal allergies     Past Surgical History:  Procedure Laterality Date   ESOPHAGOGASTRODUODENOSCOPY  2014   Eosinophilic esophagitis   INGUINAL HERNIA REPAIR Left 01/13/2019   Procedure: left inguinal hernia repair with mesh;  Surgeon: Curvin Deward MOULD, MD;  Location: [facility];  Service: General;  Laterality: Left;   INSERTION OF MESH Left 01/13/2019   Procedure: INSERTION OF MESH;  Surgeon: Curvin Deward MOULD, MD;  Location: [facility];  Service: General;  Laterality: Left;   WISDOM TOOTH EXTRACTION      Social History   Socioeconomic History   Marital status: Married    Spouse name: Not on file   Number of children: Not on file   Years of education: Not on file    Highest education level: Not on file  Occupational History   Not on file  Tobacco Use   Smoking status: Never   Smokeless tobacco: Current    Types: Snuff  Vaping Use   Vaping status: Never Used  Substance and Sexual Activity   Alcohol use: Yes    Alcohol/week: 2.0 standard drinks of alcohol    Types: 2 Cans of beer per week    Comment: occassionally   Drug use: No   Sexual activity: Not on file  Other Topics Concern   Not on file  Social History Narrative   Not on file   Social Drivers of Health   Financial Resource Strain: Not on file  Food Insecurity: Not on file  Transportation Needs: Not on file  Physical Activity: Not on file  Stress: Not on file  Social Connections: Unknown (06/05/2021)   [facility]   Social Network    Social Network: Not on file    Family History  Problem Relation Age of Onset   Hypertension Father     Health Maintenance  Topic Date Due   Colonoscopy  Never done   COVID-19 Vaccine (1 - 2025-26 season) 01/16/2024 (Originally 09/29/2023)   Influenza Vaccine  04/27/2024 (Originally 08/29/2023)   Pneumococcal Vaccine (1 of 2 - PCV) 12/30/2024 (  Originally 06/27/1994)   Hepatitis B Vaccines 19-59 Average Risk (1 of 3 - 19+ 3-dose series) 12/30/2024 (Originally 06/27/1994)   DTaP/Tdap/Td (2 - Td or Tdap) 01/15/2027   Hepatitis C Screening  Completed   HIV Screening  Completed   HPV VACCINES  Aged Out   Meningococcal B Vaccine  Aged Out     ----------------------------------------------------------------------------------------------------------------------------------------------------------------------------------------------------------------- Physical Exam BP 104/71   Pulse 84   Ht 5' 11 (1.803 m)   Wt 193 lb 4 oz (87.7 kg)   SpO2 97%   BMI 26.95 kg/m   Physical Exam Constitutional:      Appearance: Normal appearance.  Eyes:     General: No scleral icterus. Cardiovascular:     Rate and Rhythm: Normal rate and  regular rhythm.  Pulmonary:     Effort: Pulmonary effort is normal.     Breath sounds: Normal breath sounds.  Skin:    Comments: Callused area over great thumb of R hand.  Callus pared down and no foreign body visualized in thumb.   Neurological:     Mental Status: He is alert.  Psychiatric:        Mood and Affect: Mood normal.        Behavior: Behavior normal.     ------------------------------------------------------------------------------------------------------------------------------------------------------------------------------------------------------------------- Assessment and Plan  Allergic rhinitis Continue nasal sprays and antihistamine.   Asthma, mild persistent Continue symbicort  and singulair .  Albuterol  as needed.    Primary osteoarthritis of both first carpometacarpal joints Meloxicam  renewed   No orders of the defined types were placed in this encounter.   No follow-ups on file.

## 2024-01-01 ENCOUNTER — Telehealth: Payer: Self-pay

## 2024-01-01 DIAGNOSIS — H1013 Acute atopic conjunctivitis, bilateral: Secondary | ICD-10-CM

## 2024-01-01 MED ORDER — METHYLPREDNISOLONE 4 MG PO TBPK: ORAL_TABLET | ORAL | 0 refills | Status: DC

## 2024-01-01 NOTE — Telephone Encounter (Signed)
 Copied from CRM #[id number]. Topic: Clinical - Medication Question >> Jan 01, 2024  9:47 AM Tinnie BROCKS wrote: Reason for CRM: Pt thought Dr. Alvia said he was going to call in prednisone  at his visit from yesterday to help him get through these severe allergies/congestion. He would like this called in asap if possible. If not possible, he says he needs to make an appointment for something to be called in for this. Please return patient's call at [phone]

## 2024-01-02 ENCOUNTER — Encounter (HOSPITAL_BASED_OUTPATIENT_CLINIC_OR_DEPARTMENT_OTHER): Payer: Self-pay | Admitting: General Surgery

## 2024-02-27 ENCOUNTER — Telehealth: Payer: Self-pay

## 2024-02-27 NOTE — Telephone Encounter (Signed)
 Copied from CRM [id number]. Topic: Clinical - Medical Advice >> Feb 27, 2024  4:07 PM Gattis SQUIBB wrote: Reason for CRM: Allergies, congestion, nose , coughing.  Wants to know what to take.  Advise   [phone]

## 2024-03-01 ENCOUNTER — Telehealth (INDEPENDENT_AMBULATORY_CARE_PROVIDER_SITE_OTHER): Admitting: Family Medicine

## 2024-03-01 ENCOUNTER — Encounter: Payer: Self-pay | Admitting: Family Medicine

## 2024-03-01 ENCOUNTER — Other Ambulatory Visit: Payer: Self-pay | Admitting: Family Medicine

## 2024-03-01 DIAGNOSIS — J3089 Other allergic rhinitis: Secondary | ICD-10-CM

## 2024-03-01 DIAGNOSIS — H1013 Acute atopic conjunctivitis, bilateral: Secondary | ICD-10-CM

## 2024-03-01 MED ORDER — PREDNISONE 10 MG (21) PO TBPK: ORAL_TABLET | ORAL | 0 refills | Status: AC

## 2024-03-01 NOTE — Assessment & Plan Note (Signed)
 Continue nasal sprays and antihistamine. Adding course of prednisone  as well.  He will let me know if not improving with this.

## 2024-03-01 NOTE — Progress Notes (Signed)
 " Shane Thomas - 49 y.o. male MRN [id number]  Date of birth: 12/12/1975   All issues noted in this document were discussed and addressed.  No physical exam was performed (except for noted visual exam findings with Video Visits).  I discussed the limitations of evaluation and management by telemedicine and the availability of in person appointments. The patient expressed understanding and agreed to proceed.  I connected withNAME@ on 03/01/24 at  9:10 AM EST by a video enabled telemedicine application and verified that I am speaking with the correct person using two identifiers.  Present at visit: Velma Ku, DO Vinie Deem   Patient Location: Home [address] [place] [address]   Provider location:   [facility]  No chief complaint on file.   HPI  Shane Thomas is a 49 y.o. male who presents via audio/video conferencing for a telehealth visit today.  Reports nasal congestion and pressure around paranasal sinuses.  He has tried numerous nasal sprays including afrin, flonase and astepro .  He is using antihistamine as well.  These do provide some relief temporarily.  Denies fever, chills, dyspnea or body aches.    ROS:  A comprehensive ROS was completed and negative except as noted per HPI  Past Medical History:  Diagnosis Date   Asthma    Eosinophilic esophagitis 09/16/2016   Status post pill impaction with esophageal rings. Currently managed by Dr Lindaann @ [facility]. Note available in Care Everywhere.    History of melanoma excision    Seasonal allergies     Past Surgical History:  Procedure Laterality Date   ESOPHAGOGASTRODUODENOSCOPY  2014   Eosinophilic esophagitis   INGUINAL HERNIA REPAIR Left 01/13/2019   Procedure: left inguinal hernia repair with mesh;  Surgeon: Curvin Deward MOULD, MD;  Location: [facility];  Service: General;  Laterality: Left;   WISDOM TOOTH EXTRACTION      Family History  Problem Relation Age of Onset   Hypertension Father      Social History   Socioeconomic History   Marital status: Married    Spouse name: Not on file   Number of children: Not on file   Years of education: Not on file   Highest education level: Not on file  Occupational History   Not on file  Tobacco Use   Smoking status: Never   Smokeless tobacco: Current    Types: Snuff  Vaping Use   Vaping status: Never Used  Substance and Sexual Activity   Alcohol use: Yes    Alcohol/week: 2.0 standard drinks of alcohol    Types: 2 Cans of beer per week    Comment: occassionally   Drug use: No   Sexual activity: Not on file  Other Topics Concern   Not on file  Social History Narrative   Not on file   Social Drivers of Health   Tobacco Use: High Risk (12/31/2023)   Patient History    Smoking Tobacco Use: Never    Smokeless Tobacco Use: Current    Passive Exposure: Not on file  Financial Resource Strain: Not on file  Food Insecurity: Not on file  Transportation Needs: Not on file  Physical Activity: Not on file  Stress: Not on file  Social Connections: Unknown (06/05/2021)   Received from [facility]   Social Network    Social Network: Not on file  Intimate Partner Violence: Unknown (05/01/2021)   Received from [facility]   HITS    Physically Hurt: Not on file  Insult or Talk Down To: Not on file    Threaten Physical Harm: Not on file    Scream or Curse: Not on file  Depression (PHQ2-9): Low Risk (08/13/2022)   Depression (PHQ2-9)    PHQ-2 Score: 0  Alcohol Screen: Not on file  Housing: Not on file  Utilities: Not on file  Health Literacy: Not on file    Current Medications[1]  EXAM:  VITALS per patient if applicable: There were no vitals taken for this visit.  GENERAL: alert, oriented, appears well and in no acute distress  HEENT: atraumatic, conjunttiva clear, no obvious abnormalities on inspection of external nose and ears  NECK: normal movements of the head and neck  LUNGS: on inspection no signs of  respiratory distress, breathing rate appears normal, no obvious gross SOB, gasping or wheezing  CV: no obvious cyanosis  MS: moves all visible extremities without noticeable abnormality  PSYCH/NEURO: pleasant and cooperative, no obvious depression or anxiety, speech and thought processing grossly intact  ASSESSMENT AND PLAN:  Discussed the following assessment and plan:  Allergic rhinitis Continue nasal sprays and antihistamine. Adding course of prednisone  as well.  He will let me know if not improving with this.       I discussed the assessment and treatment plan with the patient. The patient was provided an opportunity to ask questions and all were answered. The patient agreed with the plan and demonstrated an understanding of the instructions.   The patient was advised to call back or seek an in-person evaluation if the symptoms worsen or if the condition fails to improve as anticipated.    Velma Ku, DO      [1]  Current Outpatient Medications:    predniSONE  (STERAPRED UNI-PAK 21 TAB) 10 MG (21) TBPK tablet, Taper as directed on packaging., Disp: 21 tablet, Rfl: 0   albuterol  (VENTOLIN  HFA) 108 (90 Base) MCG/ACT inhaler, Inhale 2 puffs into the lungs every 6 (six) hours as needed for wheezing or shortness of breath., Disp: 8 g, Rfl: 3   Azelastine -Fluticasone  137-50 MCG/ACT SUSP, USE 1 SPRY IN EACH NOSTRIL TWICE DAILY, Disp: 23 g, Rfl: 0   budesonide -formoterol  (SYMBICORT ) 160-4.5 MCG/ACT inhaler, Inhale 2 puffs into the lungs 2 (two) times daily. Rinse mouth after use., Disp: 6 g, Rfl: 6   levocetirizine (XYZAL ) 5 MG tablet, Take 1 tablet (5 mg total) by mouth every evening., Disp: 90 tablet, Rfl: 3   meloxicam  (MOBIC ) 15 MG tablet, One tab PO every 24 hours with a meal for 2 weeks, then once every 24 hours prn pain., Disp: 30 tablet, Rfl: 3   montelukast  (SINGULAIR ) 10 MG tablet, Take 1 tablet (10 mg total) by mouth at bedtime., Disp: 90 tablet, Rfl: 3  "
